# Patient Record
Sex: Male | Born: 1985 | Race: White | Marital: Married | Smoking: Never smoker
Health system: Southern US, Community
[De-identification: ages and names within clinical notes are randomized; demographics above are authoritative.]

## PROBLEM LIST (undated history)

## (undated) DIAGNOSIS — N200 Calculus of kidney: Secondary | ICD-10-CM

## (undated) DIAGNOSIS — E669 Obesity, unspecified: Secondary | ICD-10-CM

## (undated) DIAGNOSIS — J45909 Unspecified asthma, uncomplicated: Secondary | ICD-10-CM

## (undated) HISTORY — PX: SHOULDER SURGERY: SHX246

## (undated) HISTORY — DX: Unspecified asthma, uncomplicated: J45.909

## (undated) HISTORY — DX: Obesity, unspecified: E66.9

## (undated) HISTORY — DX: Calculus of kidney: N20.0

## (undated) HISTORY — PX: VASECTOMY: SHX75

---

## 2004-06-20 ENCOUNTER — Emergency Department: Payer: Self-pay | Admitting: General Practice

## 2004-06-25 ENCOUNTER — Ambulatory Visit: Payer: Self-pay | Admitting: Urology

## 2006-08-08 ENCOUNTER — Emergency Department: Payer: Self-pay | Admitting: Unknown Physician Specialty

## 2008-05-23 ENCOUNTER — Emergency Department: Payer: Self-pay | Admitting: Emergency Medicine

## 2009-02-18 ENCOUNTER — Ambulatory Visit: Payer: Self-pay | Admitting: Sports Medicine

## 2013-07-07 LAB — COMPREHENSIVE METABOLIC PANEL
Alkaline Phosphatase: 52 U/L
BUN: 15 mg/dL (ref 7–18)
Calcium, Total: 9.2 mg/dL (ref 8.5–10.1)
Chloride: 106 mmol/L (ref 98–107)
Co2: 26 mmol/L (ref 21–32)
Creatinine: 1.1 mg/dL (ref 0.60–1.30)
Glucose: 122 mg/dL — ABNORMAL HIGH (ref 65–99)
Potassium: 3.3 mmol/L — ABNORMAL LOW (ref 3.5–5.1)
SGOT(AST): 32 U/L (ref 15–37)

## 2013-07-07 LAB — CBC
HCT: 41.5 % (ref 40.0–52.0)
HGB: 14 g/dL (ref 13.0–18.0)
MCH: 31.8 pg (ref 26.0–34.0)
Platelet: 235 10*3/uL (ref 150–440)
RBC: 4.41 10*6/uL (ref 4.40–5.90)

## 2013-07-08 ENCOUNTER — Observation Stay: Payer: Self-pay

## 2013-07-08 ENCOUNTER — Ambulatory Visit: Payer: Self-pay

## 2013-07-08 LAB — URINALYSIS, COMPLETE
Bacteria: NONE SEEN
Bilirubin,UR: NEGATIVE
Glucose,UR: NEGATIVE mg/dL (ref 0–75)
Ketone: NEGATIVE
Leukocyte Esterase: NEGATIVE
Nitrite: NEGATIVE
Nitrite: NEGATIVE
Ph: 5 (ref 4.5–8.0)
Protein: NEGATIVE
RBC,UR: 122 /HPF (ref 0–5)
RBC,UR: 19 /HPF (ref 0–5)
Specific Gravity: 1.02 (ref 1.003–1.030)
Specific Gravity: 1.014 (ref 1.003–1.030)
Squamous Epithelial: NONE SEEN
Squamous Epithelial: <1
WBC UR: 5 /HPF (ref 0–5)

## 2013-07-09 LAB — URINE CULTURE

## 2013-07-11 DIAGNOSIS — Z87442 Personal history of urinary calculi: Secondary | ICD-10-CM | POA: Insufficient documentation

## 2013-07-12 ENCOUNTER — Ambulatory Visit: Payer: Self-pay | Admitting: Urology

## 2013-07-16 ENCOUNTER — Ambulatory Visit: Payer: Self-pay | Admitting: Urology

## 2013-07-23 ENCOUNTER — Ambulatory Visit: Payer: Self-pay | Admitting: Urology

## 2013-08-16 ENCOUNTER — Ambulatory Visit: Payer: Self-pay | Admitting: Urology

## 2014-08-20 DIAGNOSIS — M7521 Bicipital tendinitis, right shoulder: Secondary | ICD-10-CM | POA: Insufficient documentation

## 2014-08-20 DIAGNOSIS — S46911A Strain of unspecified muscle, fascia and tendon at shoulder and upper arm level, right arm, initial encounter: Secondary | ICD-10-CM | POA: Insufficient documentation

## 2014-11-09 ENCOUNTER — Emergency Department
Admit: 2014-11-09 | Disposition: A | Discharge status: Home / Self Care | Payer: Self-pay | Admitting: Emergency Medicine

## 2014-11-09 LAB — COMPREHENSIVE METABOLIC PANEL
ALBUMIN: 4 g/dL
ALK PHOS: 55 U/L
ALT: 36 U/L
AST: 24 U/L
Anion Gap: 5 — ABNORMAL LOW (ref 7–16)
BILIRUBIN TOTAL: 0.6 mg/dL
BUN: 11 mg/dL
Calcium, Total: 8.7 mg/dL — ABNORMAL LOW
Chloride: 109 mmol/L
Co2: 25 mmol/L
Creatinine: 1.04 mg/dL
EGFR (African American): >60
EGFR (Non-African Amer.): >60
GLUCOSE: 115 mg/dL — AB
POTASSIUM: 3.5 mmol/L
Sodium: 139 mmol/L
Total Protein: 7 g/dL

## 2014-11-09 LAB — URINALYSIS, COMPLETE
Bacteria: NONE SEEN
Bilirubin,UR: NEGATIVE
Blood: NEGATIVE
GLUCOSE, UR: NEGATIVE mg/dL (ref 0–75)
Ketone: NEGATIVE
Leukocyte Esterase: NEGATIVE
Nitrite: NEGATIVE
PROTEIN: NEGATIVE
Ph: 6 (ref 4.5–8.0)
Specific Gravity: 1.019 (ref 1.003–1.030)
Squamous Epithelial: NONE SEEN

## 2014-11-09 LAB — LIPASE, BLOOD: LIPASE: 33 U/L

## 2014-11-09 LAB — CBC
HCT: 43.8 % (ref 40.0–52.0)
HGB: 15 g/dL (ref 13.0–18.0)
MCH: 32.9 pg (ref 26.0–34.0)
MCHC: 34.4 g/dL (ref 32.0–36.0)
MCV: 96 fL (ref 80–100)
PLATELETS: 244 10*3/uL (ref 150–440)
RBC: 4.57 10*6/uL (ref 4.40–5.90)
RDW: 13.2 % (ref 11.5–14.5)
WBC: 6.5 10*3/uL (ref 3.8–10.6)

## 2014-11-15 NOTE — H&P (Signed)
Subjective/Chief Complaint Right flank pain   History of Present Illness 29 yo male in normal state of health until this evening when he had acute right back pain.  The patient was doubled over in pain and presented to the ED.  CT diagnosed right ureteral stone measuring 1cm with hydronephrosis.  After several doses of pain meds (morphine) urology was consulted for intractable pain.  Patient rates pain as 7/10.  He has a history of nephrolithiasis 7 years ago treated with ESWL.  He is unable to recall his urologist   Past History Shoulder surgery x 2 ESWL   Past Med/Surgical Hx:  Kidney Stones:   Denies medical history:   ALLERGIES:  No Known Allergies:   Family and Social History:  Family History Non-Contributory  no history of nephrolithiasis   Social History chewing tobacco   Place of Living Home   Review of Systems:  Subjective/Chief Complaint right back pain/abdominal pain   Fever/Chills No   Cough No   Sputum No   Abdominal Pain Yes   Diarrhea No   Constipation No   Nausea/Vomiting No   SOB/DOE No   Chest Pain No   Dysuria No   Physical Exam:  GEN well developed, well nourished, uncomfortable   HEENT PERRL   NECK supple   RESP normal resp effort   CARD regular rate   ABD positive Flank Tenderness   EXTR negative edema   SKIN normal to palpation   NEURO cranial nerves intact   PSYCH A+O to time, place, person   Lab Results: Hepatic:  13-Dec-14 21:55   Bilirubin, Total 0.4  Alkaline Phosphatase 52 (45-117 NOTE: New Reference Range 06/15/13)  SGPT (ALT) 38  SGOT (AST) 32  Total Protein, Serum 7.3  Albumin, Serum 3.9  Routine Chem:  13-Dec-14 21:55   Glucose, Serum  122  BUN 15  Creatinine (comp) 1.10  Sodium, Serum 137  Potassium, Serum  3.3  Chloride, Serum 106  CO2, Serum 26  Calcium (Total), Serum 9.2  Osmolality (calc) 276  eGFR (African American) >60  eGFR (Non-African American) >60 (eGFR values <70mL/min/1.73 m2  may be an indication of chronic kidney disease (CKD). Calculated eGFR is useful in patients with stable renal function. The eGFR calculation will not be reliable in acutely ill patients when serum creatinine is changing rapidly. It is not useful in  patients on dialysis. The eGFR calculation may not be applicable to patients at the low and high extremes of body sizes, pregnant women, and vegetarians.)  Anion Gap  5  Routine Hem:  13-Dec-14 21:55   WBC (CBC) 8.0  RBC (CBC) 4.41  Hemoglobin (CBC) 14.0  Hematocrit (CBC) 41.5  Platelet Count (CBC) 235 (Result(s) reported on 07 Jul 2013 at 10:14PM.)  MCV 94  MCH 31.8  MCHC 33.7  RDW 12.6   Radiology Results: LabUnknown:    14-Dec-14 00:55, CT Abdomen Pelvis WO for Stone  PACS Image  CT:  CT Abdomen Pelvis WO for Stone  REASON FOR EXAM:    R flank pain, R abd pain, h/o stones >7 years ago  COMMENTS:       PROCEDURE: CT  - CT ABDOMEN /PELVIS WO (STONE)  - Jul 08 2013 12:55AM     CLINICAL DATA:  Sudden onset of right flank pain.    EXAM:  CT ABDOMEN AND PELVIS WITHOUT    TECHNIQUE:  Multidetector CT imaging of the abdomen and pelvis was performed  following the standard protocol without IV contrast.  COMPARISON:  Report from prior CT 06/20/2004.  FINDINGS:  Bilateral intrarenal calculi, the majority of which measure 2-3 mm  in size. Marked right hydronephrosis and proximal hydroureter  secondary to a 5 x 10 mm calculus which has lodged opposite the  L2-L3 disc space. No left renal obstruction.    Within limits of unenhanced technique, normal appearing kidneys.  Again, within limits of unenhanced technique, remaining visualized  upper abdomen unremarkable. Visualized extreme lung bases clear. No  distal ureteral calculi on either side. Appendix identified and  normal.    Renal calculi   noted on the previous study from 2005.     IMPRESSION:  Bilateral nephrolithiasis.    Right hydronephrosis and proximal  hydroureter secondary to a 5 x 10  mm proximal ureteral calculus.      Electronically Signed    By: Rolla Flatten M.D.    On: 07/08/2013 01:29         Verified By: Staci Righter, M.D.,    Assessment/Admission Diagnosis 29 yo male with pain not resolved with pain meds 2/2 right ureteral nephrolithiasis Patient unlikely to pass 1cm stone  -To OR for right ureteral stent placement -Observation and possible d/c home -Plan for patient to f/u Dr. Bernardo Heater for ESWL vs. laser lithotripsy   Electronic Signatures: Margy Clarks (MD)  (Signed 14-Dec-14 04:16)  Authored: CHIEF COMPLAINT and HISTORY, PAST MEDICAL/SURGIAL HISTORY, ALLERGIES, FAMILY AND SOCIAL HISTORY, REVIEW OF SYSTEMS, PHYSICAL EXAM, LABS, Radiology, ASSESSMENT AND PLAN   Last Updated: 14-Dec-14 04:16 by Margy Clarks (MD)

## 2014-11-15 NOTE — Op Note (Signed)
PATIENT NAME:  Phillip Lucas, Phillip Lucas MR#:  [id number] DATE OF BIRTH:  1985/12/25  DATE OF PROCEDURE:  07/08/2013  PREOPERATIVE  DIAGNOSIS:  Right proximal nephrolithiasis.  POSTOPERATIVE DIAGNOSES:   1.  Right proximal nephrolithiasis. 2.  Right Hydronephrosis.  ANESTHESIA:  LMA  SPECIMENS:  Urinalysis and urine culture.  DRAINS: 6 x 26 double J ureteral stent.    PROCEDURES: 1.  Cystoscopy. 2.  Right retrograde pyelogram. 3.  Right ureteral stent placement.  INDICATION FOR PROCEDURE:  [name] is a 29 year old male who presented to the Emergency Department with pain that started in the evening of 12/13.  His pain was intractable and had not resolved with multiple doses of pain medication.  The patient had a CT scan which showed a right proximal 1 cm ureteral stone.  After discussing the risks, benefits, complications and all possible outcomes of the procedure, the patient agreed to proceed to the operating room for placement of a right ureteral stent for pain.   DESCRIPTION OF PROCEDURE:  After signing informed consent, the patient was brought back to the operating room and placed in the supine position.  Anesthesia was induced and the patient was placed in a lithotomy position.  He was prepped and draped in sterile fashion.  A dose of antibiotics was given prior to procedure.  A timeout was then performed.    The cystoscope was advanced into the urethra.  The bladder was visualized.  There were no abnormalities within the bladder.  The right ureteral orifice was identified and cannulated with a Sensor guide wire. An opened-ended ureteral stent was placed over the Sensor guide wire.  This was placed passed the proximal stone.  The Sensor guide wire was then removed and a urine sample was taken from the renal pelvis.  At this time, using Conray diluted with saline, we did a right retrograde pyelogram which showed significant hydronephrosis on the right side.  The sensor guide wire was then replaced  into the renal pelvis and a coil was noted.  The open-ended ureteral stent was removed.  We then placed a 6 x 26 double J ureteral stent over the Sensor guide wire into the renal pelvis.  This was done using fluoroscopic guidance.  The Sensor guide wire was removed and a coil was noted both in the renal pelvis as well as visualized within the bladder.  The bladder was emptied.  The patient was awoken from anesthesia without any difficulty.  The patient tolerated the procedure well.  I was present and scrubbed for the entire case.     ____________________________ Margy Clarks, MD erm:dp D: 07/11/2013 09:34:55 ET T: 07/11/2013 10:55:01 ET JOB#: [id number]  cc: Margy Clarks, MD, <Dictator> Lodema [facility] MD ELECTRONICALLY SIGNED 07/11/2013 13:47

## 2016-01-20 ENCOUNTER — Other Ambulatory Visit
Admission: RE | Admit: 2016-01-20 | Discharge: 2016-01-20 | Disposition: A | Discharge status: Home / Self Care | Payer: Self-pay | Source: Ambulatory Visit | Attending: Family Medicine | Admitting: Family Medicine

## 2016-01-20 DIAGNOSIS — R0609 Other forms of dyspnea: Secondary | ICD-10-CM | POA: Insufficient documentation

## 2016-01-20 DIAGNOSIS — R635 Abnormal weight gain: Secondary | ICD-10-CM | POA: Insufficient documentation

## 2016-01-20 DIAGNOSIS — R600 Localized edema: Secondary | ICD-10-CM | POA: Insufficient documentation

## 2016-01-20 LAB — BRAIN NATRIURETIC PEPTIDE: B Natriuretic Peptide: 13 pg/mL (ref 0.0–100.0)

## 2016-02-09 DIAGNOSIS — R600 Localized edema: Secondary | ICD-10-CM | POA: Insufficient documentation

## 2016-02-09 DIAGNOSIS — I10 Essential (primary) hypertension: Secondary | ICD-10-CM | POA: Insufficient documentation

## 2017-03-06 IMAGING — CT CT ABD-PELV W/ CM
1 series · 15 of 32 positions shown, 19 images · IV contrast (omnipaque)
Comparison: Prior CT abdomen/ pelvis 07/08/2013

CLINICAL DATA: 28-year-old male with 2 day history of sharp right
lower quadrant pain

EXAM:
CT ABDOMEN AND PELVIS WITH CONTRAST
TECHNIQUE: Multidetector CT imaging of the abdomen and pelvis was performed
using the standard protocol following bolus administration of
intravenous contrast.
CONTRAST:  100 mL Omnipaque 300

[Series 2: routine abd pel with · axial · 0.79mm/px · z∈[-528,-102]mm · 15 of 96 slices shown, 19 images]
[im 7/96  soft-tissue]
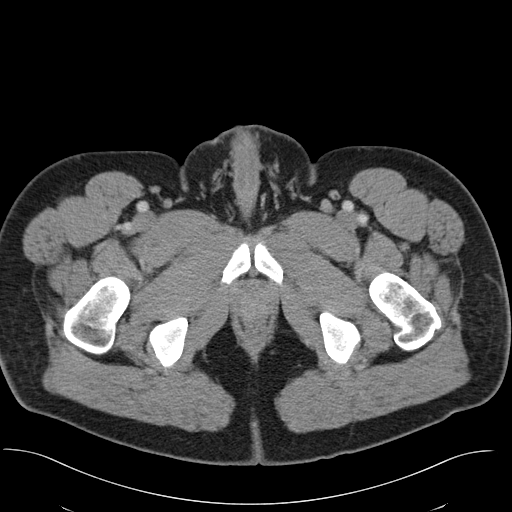
[im 7/96  bone]
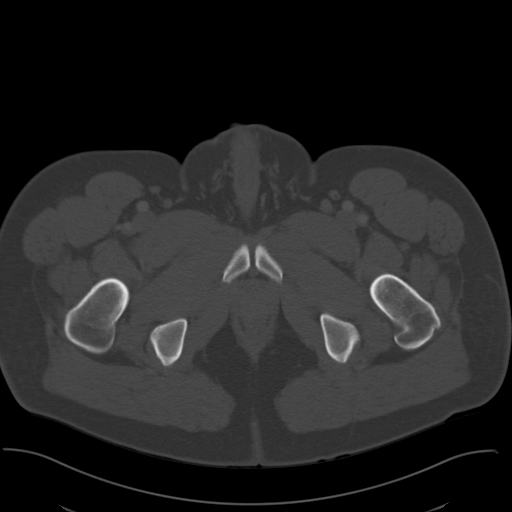
[im 13/96  soft-tissue]
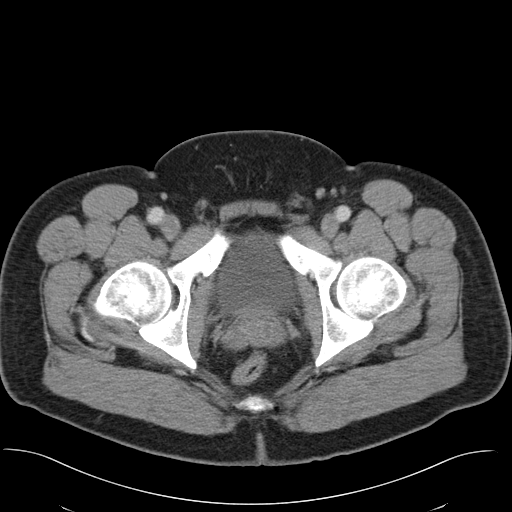
[im 19/96  soft-tissue]
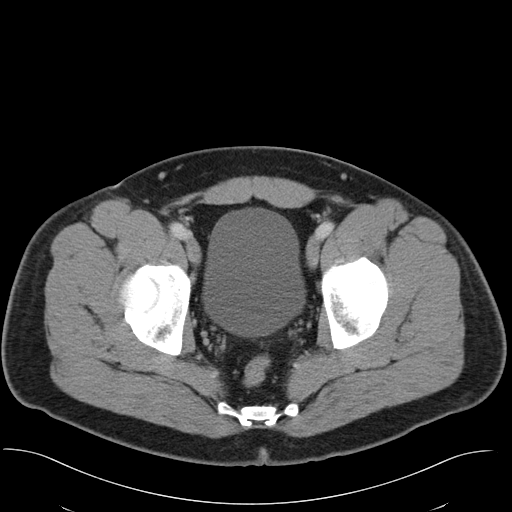
[im 28/96  soft-tissue]
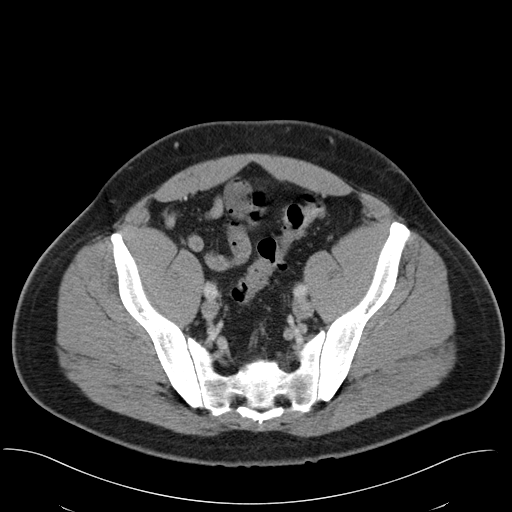
[im 34/96  soft-tissue]
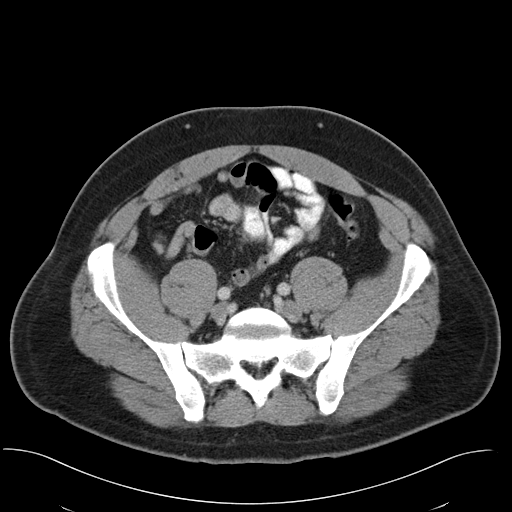
[im 40/96  soft-tissue]
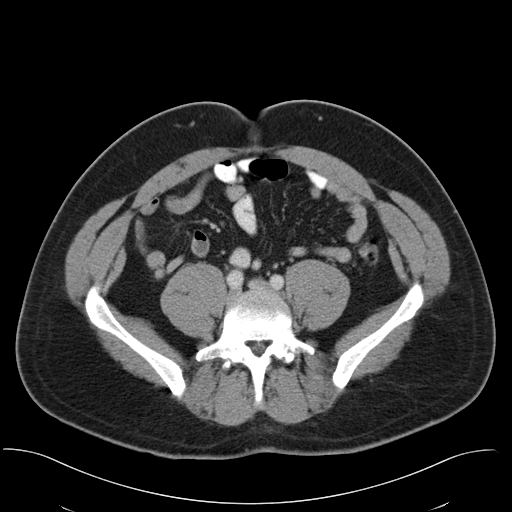
[im 50/96  soft-tissue]
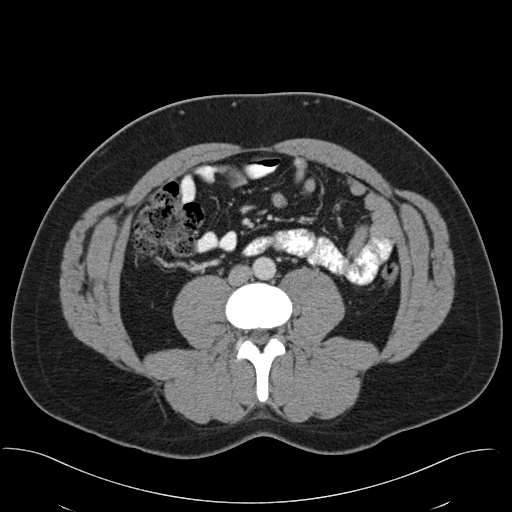
[im 56/96  soft-tissue]
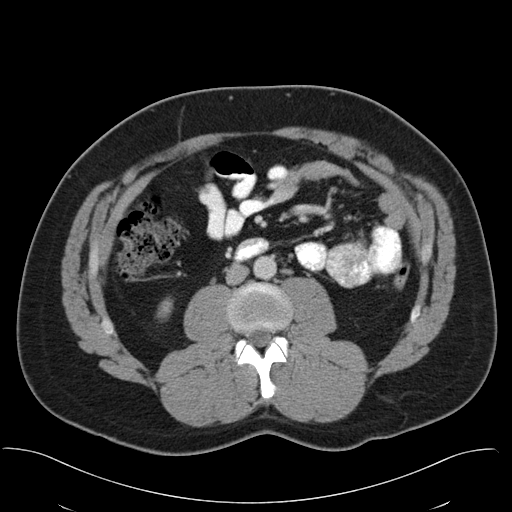
[im 62/96  soft-tissue]
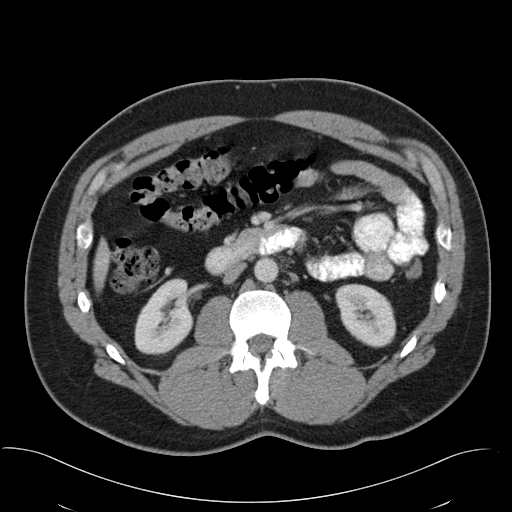
[im 62/96  bone]
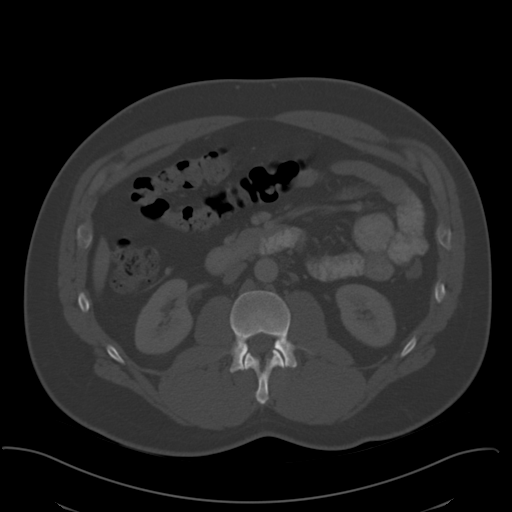
[im 68/96  soft-tissue]
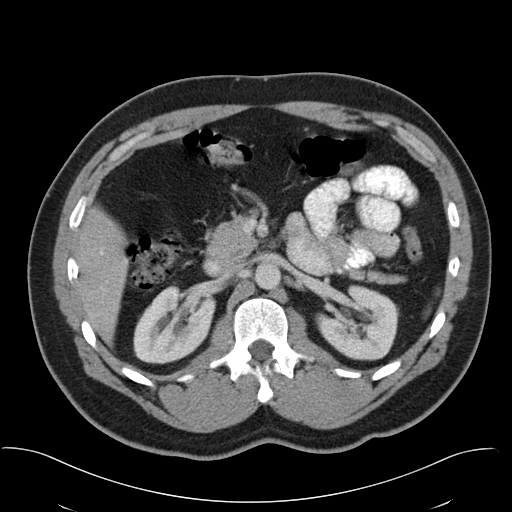
[im 77/96  soft-tissue]
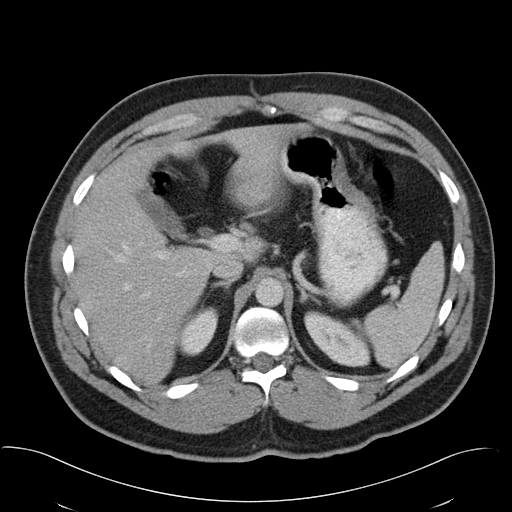
[im 83/96  soft-tissue]
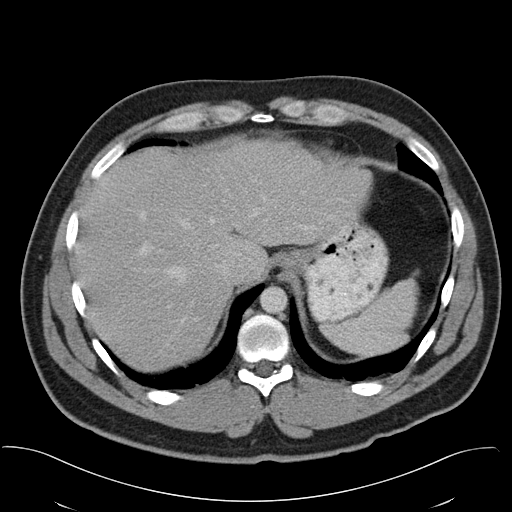
[im 83/96  lung]
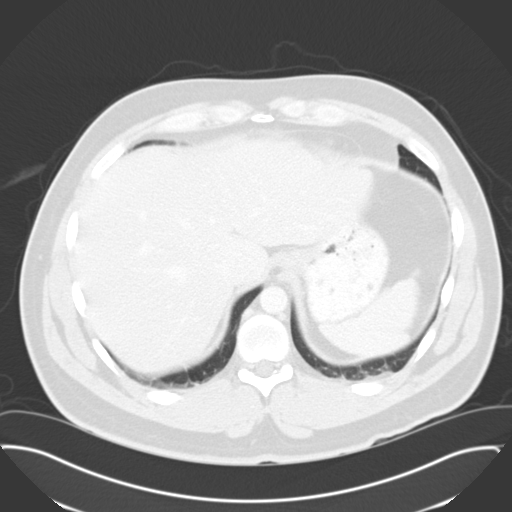
[im 86/96  lung]
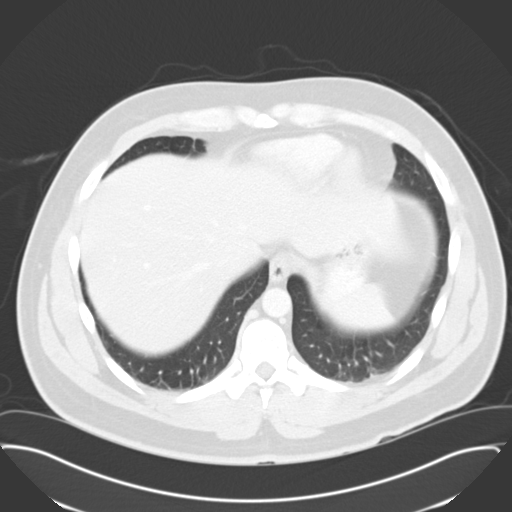
[im 89/96  soft-tissue]
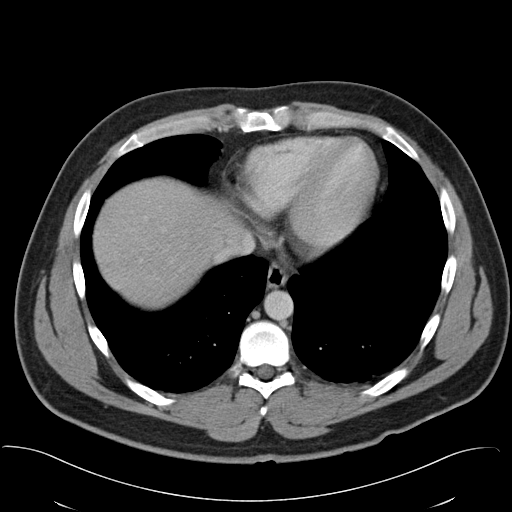
[im 89/96  lung]
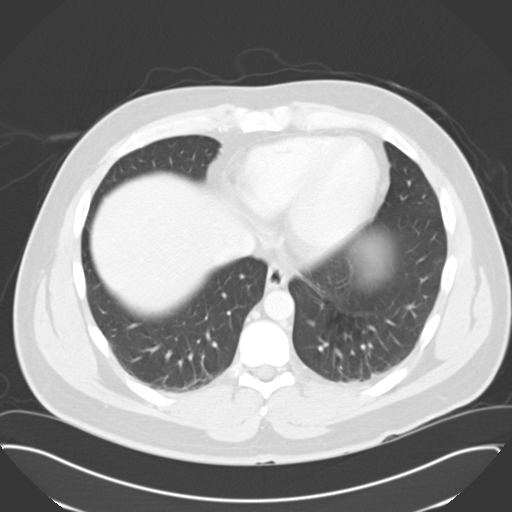
[im 92/96  lung]
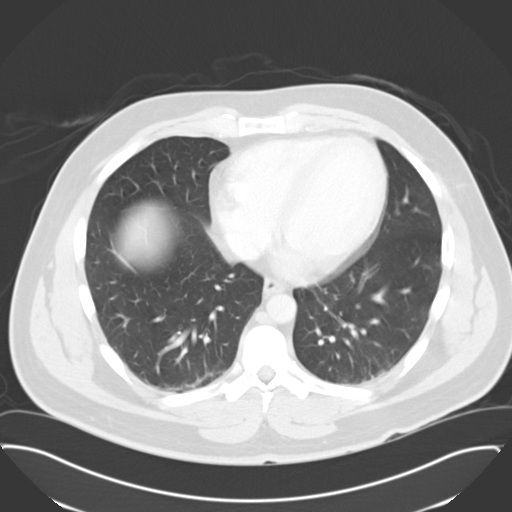

[15 of 32 positions shown; findings below may reference images not displayed]

FINDINGS: Lower Chest: The lung bases are clear. Visualized cardiac structures
are within normal limits for size. No pericardial effusion.
Unremarkable visualized distal thoracic esophagus.

Abdomen: Unremarkable CT appearance of the stomach, duodenum,
spleen, adrenal glands and pancreas. Normal hepatic contour and
morphology. No discrete lesion. Gallbladder is unremarkable. No
intra or extrahepatic biliary ductal dilatation.

Unremarkable appearance of the bilateral kidneys. No focal solid
lesion, hydronephrosis or nephrolithiasis.

No evidence of obstruction or focal bowel wall thickening. Normal
appendix in the right lower quadrant. The terminal ileum is
unremarkable. No free fluid or suspicious adenopathy.

Pelvis: Unremarkable bladder, prostate gland and seminal vesicles.
No free fluid or suspicious adenopathy.

Bones/Soft Tissues: No acute fracture or aggressive appearing lytic
or blastic osseous lesion. Small fat containing periumbilical
hernia.

Vascular: No significant atherosclerotic vascular disease,
aneurysmal dilatation or acute abnormality.
IMPRESSION: Negative CT scan of the abdomen and pelvis.

## 2017-03-06 IMAGING — US US RENAL KIDNEY
1 series · 14 of 25 positions shown · non-contrast
Comparison: Prior CT scan of the abdomen 07/08/2013

CLINICAL DATA: 28-year-old male with right side pain, history of
renal stones

EXAM:
RENAL/URINARY TRACT ULTRASOUND COMPLETE

[Series 1: us renal kidney · 0.25mm/px · 14 of 54 slices shown]
[im 1/54]
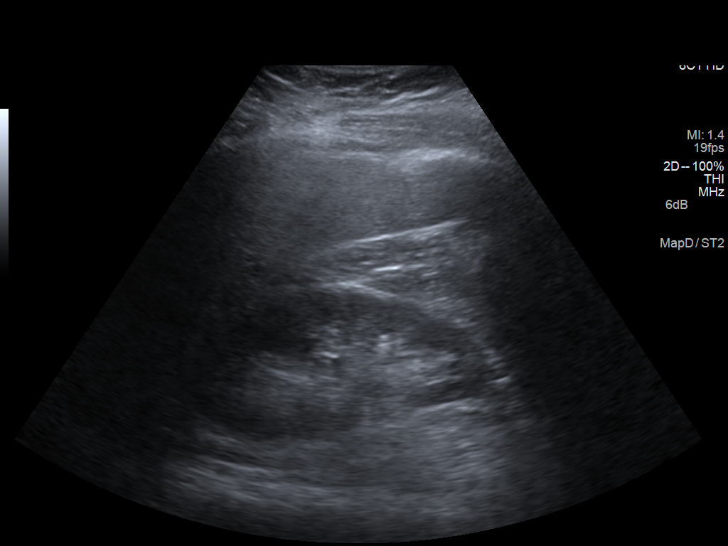
[im 5/54]
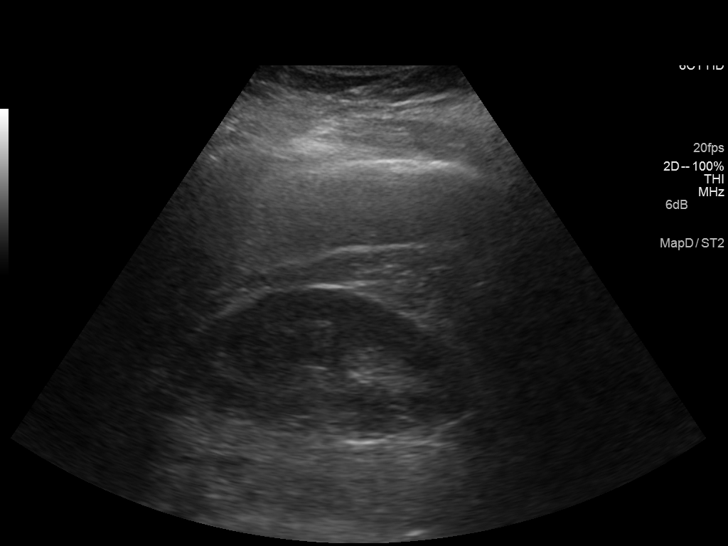
[im 9/54]
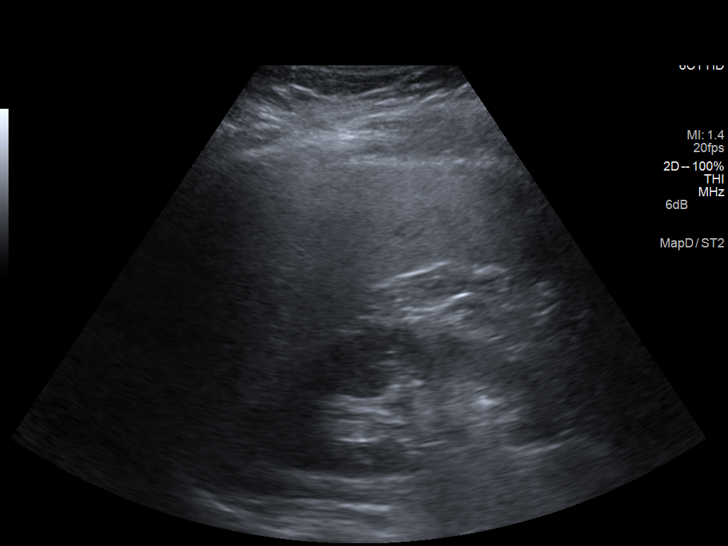
[im 14/54]
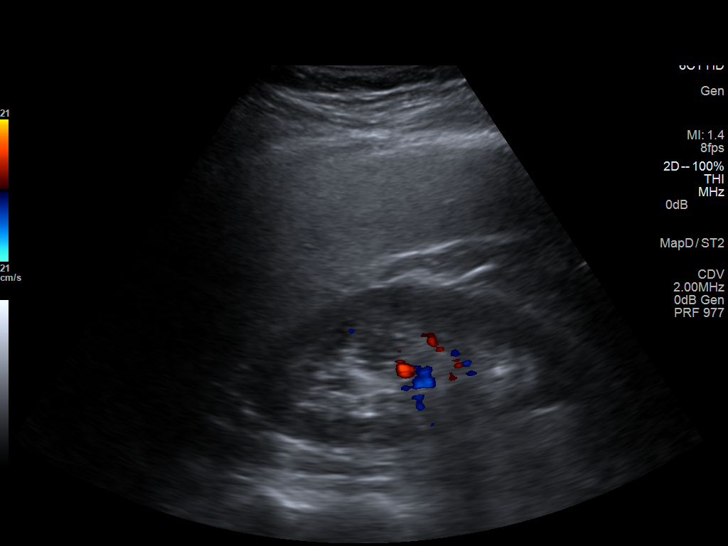
[im 18/54]
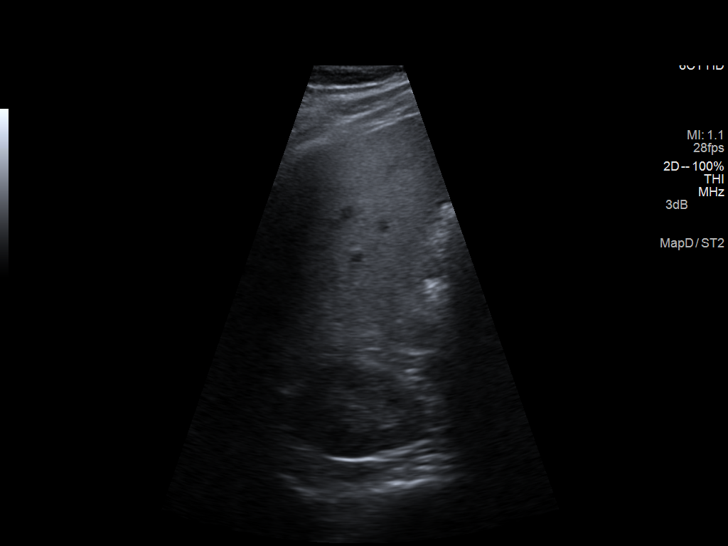
[im 20/54]
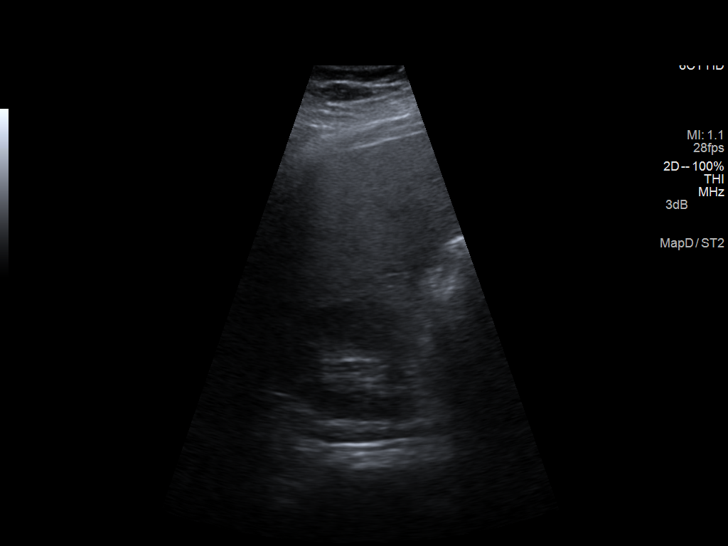
[im 25/54]
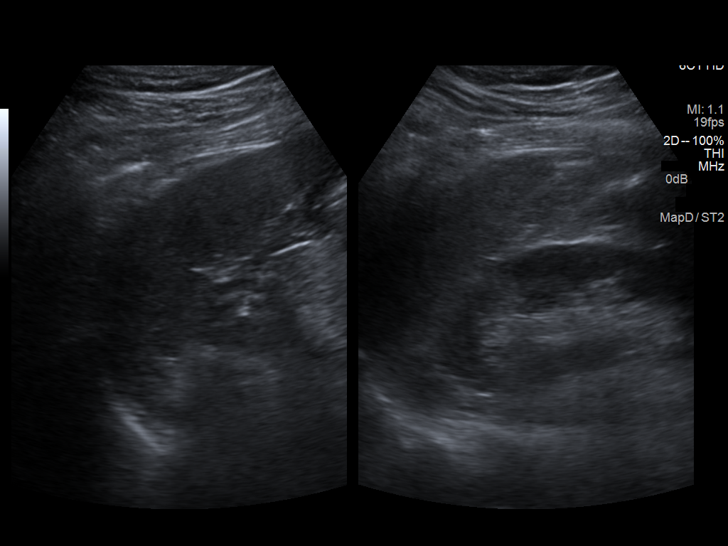
[im 29/54]
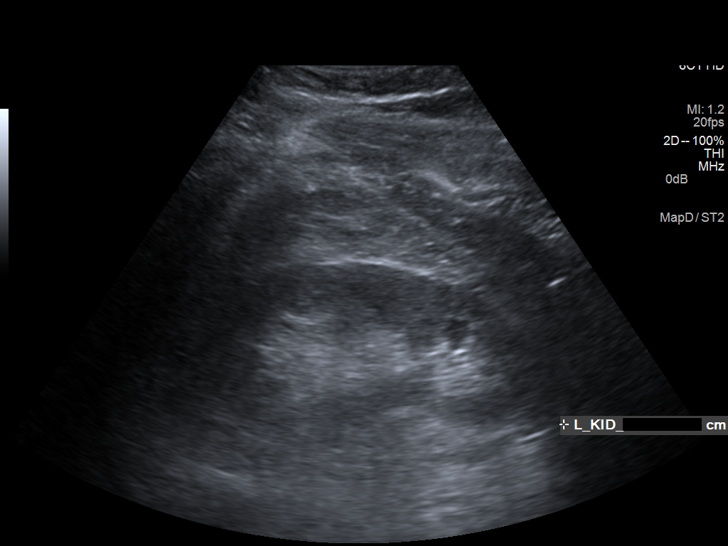
[im 34/54]
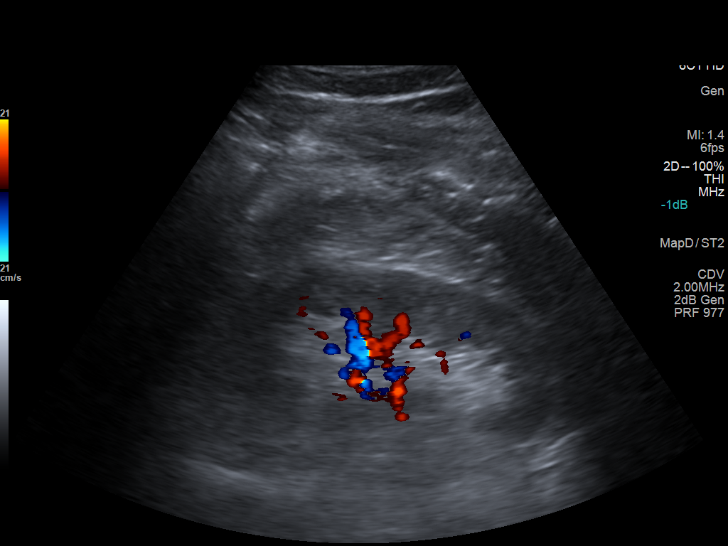
[im 36/54]
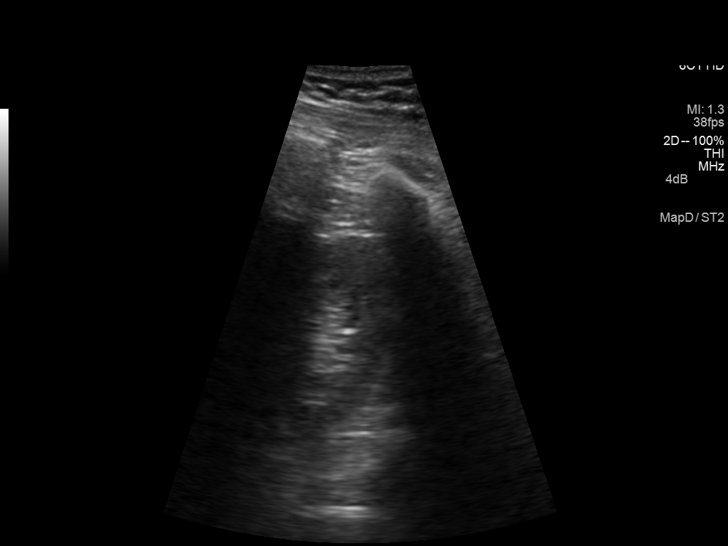
[im 40/54]
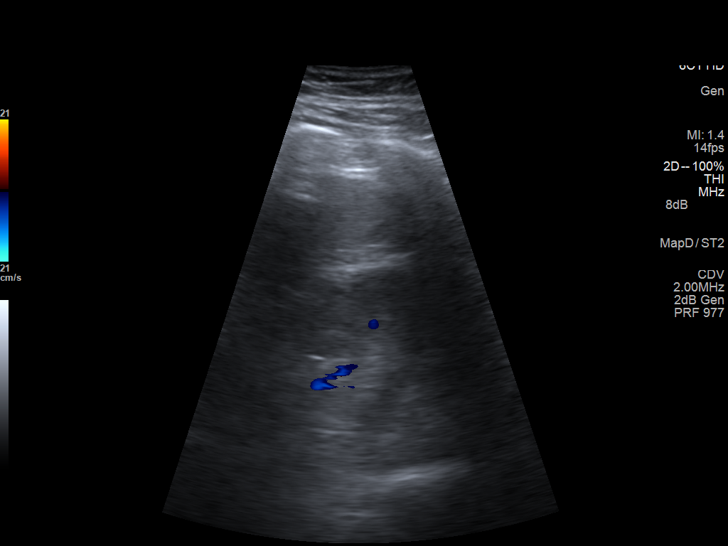
[im 45/54]
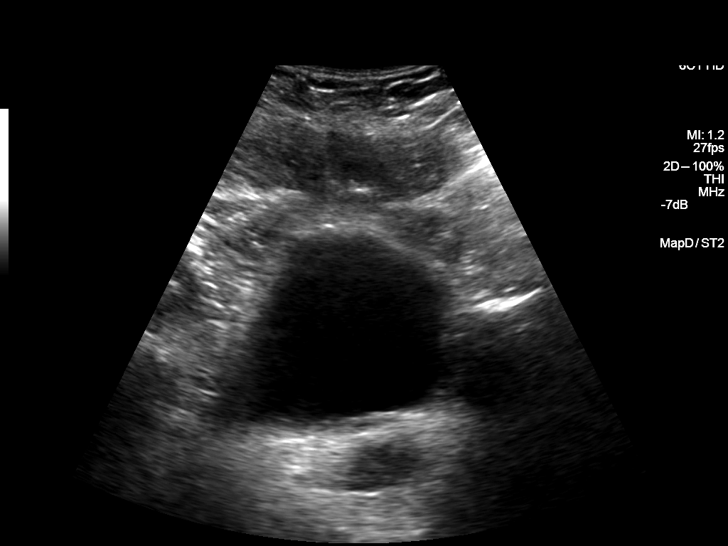
[im 49/54]
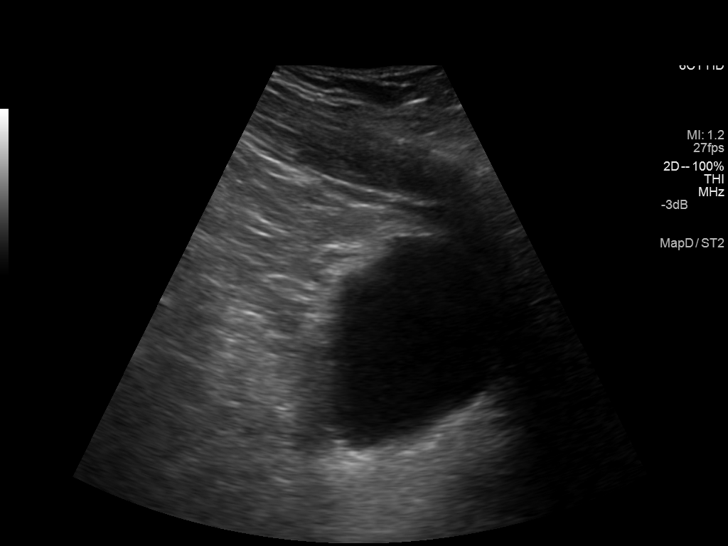
[im 54/54]
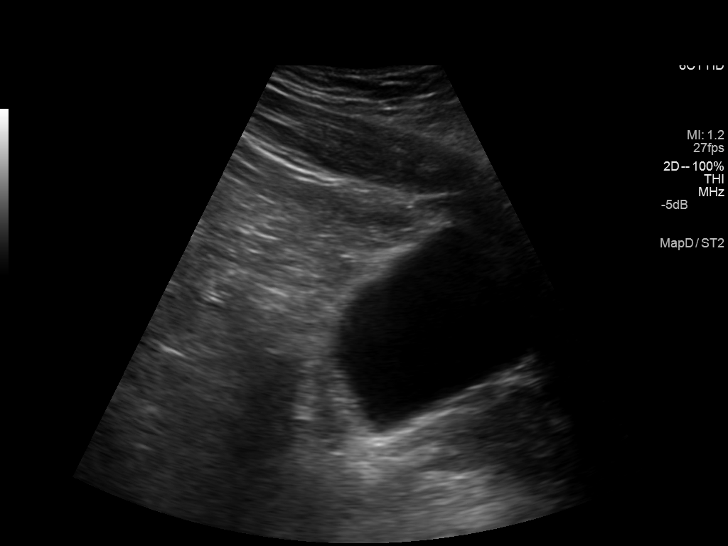

[14 of 25 positions shown; findings below may reference images not displayed]

FINDINGS: Right Kidney:

Length: 11 cm. Echogenicity within normal limits. No mass or
hydronephrosis visualized.

Left Kidney:

Length: 11.2 cm. Echogenicity within normal limits. No mass or
hydronephrosis visualized.

Bladder:

Appears normal for degree of bladder distention.
IMPRESSION: Normal renal ultrasound.  No evidence of hydronephrosis.

## 2018-12-25 ENCOUNTER — Encounter: Payer: Self-pay | Admitting: Family Medicine

## 2018-12-25 ENCOUNTER — Ambulatory Visit (INDEPENDENT_AMBULATORY_CARE_PROVIDER_SITE_OTHER): Payer: BC Managed Care – PPO | Admitting: Family Medicine

## 2018-12-25 VITALS — Ht 71.5 in | Wt 211.0 lb

## 2018-12-25 DIAGNOSIS — Z8249 Family history of ischemic heart disease and other diseases of the circulatory system: Secondary | ICD-10-CM | POA: Diagnosis not present

## 2018-12-25 DIAGNOSIS — E663 Overweight: Secondary | ICD-10-CM | POA: Diagnosis not present

## 2018-12-25 DIAGNOSIS — R739 Hyperglycemia, unspecified: Secondary | ICD-10-CM | POA: Diagnosis not present

## 2018-12-25 DIAGNOSIS — E669 Obesity, unspecified: Secondary | ICD-10-CM | POA: Insufficient documentation

## 2018-12-25 DIAGNOSIS — Z87442 Personal history of urinary calculi: Secondary | ICD-10-CM | POA: Diagnosis not present

## 2018-12-25 DIAGNOSIS — Z720 Tobacco use: Secondary | ICD-10-CM

## 2018-12-25 NOTE — Assessment & Plan Note (Signed)
Encourage cessation Patient is pre-contemplative at this time as he feels like his stress level is too high to consider quitting We will continue to reassess

## 2018-12-25 NOTE — Assessment & Plan Note (Signed)
Discussed with patient the importance of monitoring for any risk factors for heart disease in him Screening labs today Check blood pressure at next visit

## 2018-12-25 NOTE — Progress Notes (Signed)
Patient: Phillip Lucas, Male    DOB: 1986-04-20, 33 y.o.   MRN: [id number] Visit Date: 12/25/2018  Today's Provider: Lavon Paganini, MD   Chief Complaint  Patient presents with  . Establish Care   Subjective:    Virtual Visit via Video Note  I connected with Phillip Lucas on 12/25/18 at 10:00 AM EDT by a video enabled telemedicine application and verified that I am speaking with the correct person using two identifiers.  Patient location: home Provider location: [place] involved in the visit: patient, provider    I discussed the limitations of evaluation and management by telemedicine and the availability of in person appointments. The patient expressed understanding and agreed to proceed.    New Patient:  Phillip Lucas is a 33 y.o. male who presents today to Establish Care. He feels well. He reports exercising 4 days per week. He reports he is sleeping well.  States that has not had PCP in nearly 10 years.  His wife is CRNA, so he will field questions by her sometimes.  Has a h/o kidney stones.  Had lithotripsy for 8-35mm stones twice.  Diet has changed and lost 65lbs and hasn't had any further problems since then.  Cut back on mountain dew and drinking more water. Used Keto Diet  States that he gets sick twice per year with bronchitis.  Was told that he had asthma and this contributed  L shoulder surgery x2 (2002 and 2010) Trauma due to football/wrestling injury  Chewing tobacco use Tried to quit ~9 months ago when newborn was born Being out of work and having newborn makes it difficult to quit at this time due to stress -----------------------------------------------------------------   Review of Systems  Constitutional: Negative.   HENT: Negative.   Eyes: Negative.   Respiratory: Negative.   Cardiovascular: Negative.   Gastrointestinal: Negative.   Endocrine: Negative.   Genitourinary: Negative.   Musculoskeletal:  Negative.   Skin: Negative.   Allergic/Immunologic: Negative.   Neurological: Negative.   Hematological: Negative.   Psychiatric/Behavioral: Negative.     Social History      He  reports that he has never smoked. His smokeless tobacco use includes snuff. He reports current alcohol use. He reports that he does not use drugs.       Social History   Socioeconomic History  . Marital status: Married    Spouse name: Not on file  . Number of children: 1  . Years of education: Not on file  . Highest education level: Not on file  Occupational History  . Occupation: Arboriculturist  . Financial resource strain: Not on file  . Food insecurity:    Worry: Not on file    Inability: Not on file  . Transportation needs:    Medical: Not on file    Non-medical: Not on file  Tobacco Use  . Smoking status: Never Smoker  . Smokeless tobacco: Current User    Types: Snuff  Substance and Sexual Activity  . Alcohol use: Yes    Comment: socially  . Drug use: Never  . Sexual activity: Yes    Partners: Female    Birth control/protection: None  Lifestyle  . Physical activity:    Days per week: Not on file    Minutes per session: Not on file  . Stress: Not on file  Relationships  . Social connections:    Talks on phone: Not on file  Gets together: Not on file    Attends religious service: Not on file    Active member of club or organization: Not on file    Attends meetings of clubs or organizations: Not on file    Relationship status: Not on file  Other Topics Concern  . Not on file  Social History Narrative  . Not on file    Past Medical History:  Diagnosis Date  . Asthma   . Calculus of kidney   . Obesity      Patient Active Problem List   Diagnosis Date Noted  . Overweight 12/25/2018  . Family history of early CAD 12/25/2018  . Chewing tobacco use 12/25/2018  . Hyperglycemia 12/25/2018  . History of kidney stones 07/11/2013    Past Surgical History:   Procedure Laterality Date  . LITHOTRIPSY  2008  . SHOULDER SURGERY Left     Family History        Family Status  Relation Name Status  . Mother  Deceased  . Father  Deceased  . MGM  Deceased  . MGF  Deceased  . PGM  Deceased  . PGF  Deceased        His family history includes Aneurysm in his paternal grandmother; COPD in his maternal grandfather, paternal grandfather, and paternal grandmother; Coronary artery disease in his father; Diabetes in his father; Drug abuse in his father and mother; Heart attack (age of onset: 58) in his father.      No Known Allergies  No current outpatient medications on file.   Patient Care Team: Virginia Crews, MD as PCP - General (Family Medicine)    Objective:    Vitals: Ht 5' 11.5" (1.816 m)   Wt 211 lb (95.7 kg)   BMI 29.02 kg/m    Vitals:   12/25/18 1007  Weight: 211 lb (95.7 kg)  Height: 5' 11.5" (1.816 m)     Physical Exam Constitutional:      Appearance: Normal appearance.  Pulmonary:     Effort: Pulmonary effort is normal. No respiratory distress.  Neurological:     Mental Status: He is alert and oriented to person, place, and time.  Psychiatric:        Mood and Affect: Mood normal.        Behavior: Behavior normal.      Depression Screen PHQ 2/9 Scores 12/25/2018  PHQ - 2 Score 0       Assessment & Plan:   I discussed the assessment and treatment plan with the patient. The patient was provided an opportunity to ask questions and all were answered. The patient agreed with the plan and demonstrated an understanding of the instructions.   The patient was advised to call back or seek an in-person evaluation if the symptoms worsen or if the condition fails to improve as anticipated.   Establish care  Exercise Activities and Dietary recommendations Goals   None      There is no immunization history on file for this patient.  Health Maintenance  Topic Date Due  . HIV Screening  06/15/1999  .  TETANUS/TDAP  06/15/2003  . INFLUENZA VACCINE  02/24/2019     Discussed health benefits of physical activity, and encouraged him to engage in regular exercise appropriate for his age and condition.    --------------------------------------------------------------------  Problem List Items Addressed This Visit      Other   History of kidney stones    Possibly diet mediated Doing much better with no  recurrent stones since changing his diet and losing weight We will monitor for any recurrence      Overweight - Primary    Congratulated patient on recent weight loss He is on the keto diet and worried about cholesterol, which we will be checking today Discussed importance of eating healthy fats as well      Relevant Orders   Lipid panel   Comprehensive metabolic panel   Hemoglobin A1c   CBC w/Diff/Platelet   Family history of early CAD    Discussed with patient the importance of monitoring for any risk factors for heart disease in him Screening labs today Check blood pressure at next visit      Relevant Orders   Lipid panel   Comprehensive metabolic panel   Hemoglobin A1c   CBC w/Diff/Platelet   Chewing tobacco use    Encourage cessation Patient is pre-contemplative at this time as he feels like his stress level is too high to consider quitting We will continue to reassess      Hyperglycemia    Noted on last labs from 2016 Check A1c      Relevant Orders   Comprehensive metabolic panel   Hemoglobin A1c       Return in about 1 year (around 12/25/2019) for CPE.   The entirety of the information documented in the History of Present Illness, Review of Systems and Physical Exam were personally obtained by me. Portions of this information were initially documented by Tiburcio Pea, CMA and reviewed by me for thoroughness and accuracy.    Winfrey Chillemi, Dionne Bucy, MD MPH [facility]

## 2018-12-25 NOTE — Patient Instructions (Signed)
Preventive Care 18-39 Years, Male Preventive care refers to lifestyle choices and visits with your health care provider that can promote health and wellness. What does preventive care include?   A yearly physical exam. This is also called an annual well check.  Dental exams once or twice a year.  Routine eye exams. Ask your health care provider how often you should have your eyes checked.  Personal lifestyle choices, including: ? Daily care of your teeth and gums. ? Regular physical activity. ? Eating a healthy diet. ? Avoiding tobacco and drug use. ? Limiting alcohol use. ? Practicing safe sex. What happens during an annual well check? The services and screenings done by your health care provider during your annual well check will depend on your age, overall health, lifestyle risk factors, and family history of disease. Counseling Your health care provider may ask you questions about your:  Alcohol use.  Tobacco use.  Drug use.  Emotional well-being.  Home and relationship well-being.  Sexual activity.  Eating habits.  Work and work environment. Screening You may have the following tests or measurements:  Height, weight, and BMI.  Blood pressure.  Lipid and cholesterol levels. These may be checked every 5 years starting at age 20.  Diabetes screening. This is done by checking your blood sugar (glucose) after you have not eaten for a while (fasting).  Skin check.  Hepatitis C blood test.  Hepatitis B blood test.  Sexually transmitted disease (STD) testing. Discuss your test results, treatment options, and if necessary, the need for more tests with your health care provider. Vaccines Your health care provider may recommend certain vaccines, such as:  Influenza vaccine. This is recommended every year.  Tetanus, diphtheria, and acellular pertussis (Tdap, Td) vaccine. You may need a Td booster every 10 years.  Varicella vaccine. You may need this if you  have not been vaccinated.  HPV vaccine. If you are 26 or younger, you may need three doses over 6 months.  Measles, mumps, and rubella (MMR) vaccine. You may need at least one dose of MMR.You may also need a second dose.  Pneumococcal 13-valent conjugate (PCV13) vaccine. You may need this if you have certain conditions and have not been vaccinated.  Pneumococcal polysaccharide (PPSV23) vaccine. You may need one or two doses if you smoke cigarettes or if you have certain conditions.  Meningococcal vaccine. One dose is recommended if you are age 19-21 years and a first-year college student living in a residence hall, or if you have one of several medical conditions. You may also need additional booster doses.  Hepatitis A vaccine. You may need this if you have certain conditions or if you travel or work in places where you may be exposed to hepatitis A.  Hepatitis B vaccine. You may need this if you have certain conditions or if you travel or work in places where you may be exposed to hepatitis B.  Haemophilus influenzae type b (Hib) vaccine. You may need this if you have certain risk factors. Talk to your health care provider about which screenings and vaccines you need and how often you need them. This information is not intended to replace advice given to you by your health care provider. Make sure you discuss any questions you have with your health care provider. Document Released: 09/07/2001 Document Revised: 02/22/2017 Document Reviewed: 05/13/2015 Elsevier Interactive Patient Education  2019 [employer]vier Inc[employer]  

## 2018-12-25 NOTE — Assessment & Plan Note (Signed)
Noted on last labs from 2016 Check A1c

## 2018-12-25 NOTE — Assessment & Plan Note (Signed)
Possibly diet mediated Doing much better with no recurrent stones since changing his diet and losing weight We will monitor for any recurrence

## 2018-12-25 NOTE — Assessment & Plan Note (Signed)
Congratulated patient on recent weight loss He is on the keto diet and worried about cholesterol, which we will be checking today Discussed importance of eating healthy fats as well

## 2018-12-28 ENCOUNTER — Telehealth: Payer: Self-pay

## 2018-12-28 LAB — CBC WITH DIFFERENTIAL/PLATELET
Basophils Absolute: 0 10*3/uL (ref 0.0–0.2)
Basos: 0 %
EOS (ABSOLUTE): 0.1 10*3/uL (ref 0.0–0.4)
Eos: 2 %
Hematocrit: 43.5 % (ref 37.5–51.0)
Hemoglobin: 15 g/dL (ref 13.0–17.7)
Immature Grans (Abs): 0 10*3/uL (ref 0.0–0.1)
Immature Granulocytes: 0 %
Lymphocytes Absolute: 1.6 10*3/uL (ref 0.7–3.1)
Lymphs: 36 %
MCH: 33 pg (ref 26.6–33.0)
MCHC: 34.5 g/dL (ref 31.5–35.7)
MCV: 96 fL (ref 79–97)
Monocytes Absolute: 0.5 10*3/uL (ref 0.1–0.9)
Monocytes: 11 %
Neutrophils Absolute: 2.3 10*3/uL (ref 1.4–7.0)
Neutrophils: 51 %
Platelets: 202 10*3/uL (ref 150–450)
RBC: 4.55 x10E6/uL (ref 4.14–5.80)
RDW: 13.2 % (ref 11.6–15.4)
WBC: 4.5 10*3/uL (ref 3.4–10.8)

## 2018-12-28 LAB — COMPREHENSIVE METABOLIC PANEL
ALT: 29 IU/L (ref 0–44)
AST: 22 IU/L (ref 0–40)
Albumin/Globulin Ratio: 2.1 (ref 1.2–2.2)
Albumin: 4.7 g/dL (ref 4.0–5.0)
Alkaline Phosphatase: 50 IU/L (ref 39–117)
BUN/Creatinine Ratio: 18 (ref 9–20)
BUN: 18 mg/dL (ref 6–20)
Bilirubin Total: 0.6 mg/dL (ref 0.0–1.2)
CO2: 22 mmol/L (ref 20–29)
Calcium: 9.2 mg/dL (ref 8.7–10.2)
Chloride: 103 mmol/L (ref 96–106)
Creatinine, Ser: 0.98 mg/dL (ref 0.76–1.27)
GFR calc Af Amer: 116 mL/min/{1.73_m2} (ref >59)
GFR calc non Af Amer: 100 mL/min/{1.73_m2} (ref >59)
Globulin, Total: 2.2 g/dL (ref 1.5–4.5)
Glucose: 93 mg/dL (ref 65–99)
Potassium: 4.1 mmol/L (ref 3.5–5.2)
Sodium: 139 mmol/L (ref 134–144)
Total Protein: 6.9 g/dL (ref 6.0–8.5)

## 2018-12-28 LAB — HEMOGLOBIN A1C
Est. average glucose Bld gHb Est-mCnc: 105 mg/dL
Hgb A1c MFr Bld: 5.3 % (ref 4.8–5.6)

## 2018-12-28 LAB — LIPID PANEL
Chol/HDL Ratio: 4.5 ratio (ref 0.0–5.0)
Cholesterol, Total: 194 mg/dL (ref 100–199)
HDL: 43 mg/dL (ref >39)
LDL Calculated: 133 mg/dL — ABNORMAL HIGH (ref 0–99)
Triglycerides: 90 mg/dL (ref 0–149)
VLDL Cholesterol Cal: 18 mg/dL (ref 5–40)

## 2018-12-28 NOTE — Telephone Encounter (Signed)
-----   Message from Virginia Crews, MD sent at 12/28/2018 11:57 AM EDT ----- Normal labs, except slightly elevated cholesterol.  Recommend diet low in saturated fat and regular exercise - 30 min at least 5 times per week

## 2018-12-28 NOTE — Telephone Encounter (Signed)
Patient notified of lab results and instructions for diet and exercise.

## 2019-07-17 ENCOUNTER — Encounter: Payer: Self-pay | Admitting: Emergency Medicine

## 2019-07-17 ENCOUNTER — Ambulatory Visit
Admission: EM | Admit: 2019-07-17 | Discharge: 2019-07-17 | Disposition: A | Discharge status: Home / Self Care | Payer: BC Managed Care – PPO | Attending: Urgent Care | Admitting: Urgent Care

## 2019-07-17 ENCOUNTER — Other Ambulatory Visit: Payer: Self-pay

## 2019-07-17 DIAGNOSIS — J069 Acute upper respiratory infection, unspecified: Secondary | ICD-10-CM

## 2019-07-17 DIAGNOSIS — Z20828 Contact with and (suspected) exposure to other viral communicable diseases: Secondary | ICD-10-CM | POA: Diagnosis not present

## 2019-07-17 DIAGNOSIS — Z20822 Contact with and (suspected) exposure to covid-19: Secondary | ICD-10-CM

## 2019-07-17 NOTE — ED Triage Notes (Signed)
Patient in today after having a +covid exposure on Friday (07/13/19). Patient started with cough on Sunday (07/15/19) and hoarseness this morning. Patient denies any other symptoms.

## 2019-07-17 NOTE — Discharge Instructions (Addendum)
It was very nice seeing you today in clinic. Thank you for entrusting me with your care.   Rest and stay HYDRATED. Water and electrolyte containing beverages (Gatorade, Pedialyte) are best to prevent dehydration and electrolyte abnormalities.  May use Tylenol and/or Ibuprofen as needed for pain/fever.    You were tested for SARS-CoV-2 (novel coronavirus) today. Testing is performed by an outside lab ([facility]) and has variable turn around times ranging between 2-5 days. Current recommendations from the the CDC and [place] DHHS require that you remain out of work in order to quarantine at home until negative test results are have been received. In the event that your test results are positive, you will be contacted with further directives. These measures are being implemented out of an abundance of caution to prevent transmission and spread during the current SARS-CoV-2 pandemic.  Make arrangements to follow up with your regular doctor in 1 week for re-evaluation if not improving. If your symptoms/condition worsens, please seek follow up care either here or in the ER. Please remember, our [place] providers are "right here with you" when you need [place].   Again, it was my pleasure to take care of you today. Thank you for choosing our clinic. I hope that you start to feel better quickly.   Honor Loh, MSN, APRN, FNP-C, CEN Advanced Practice Provider [facility]

## 2019-07-18 LAB — NOVEL CORONAVIRUS, NAA (HOSP ORDER, SEND-OUT TO REF LAB; TAT 18-24 HRS): SARS-CoV-2, NAA: NOT DETECTED

## 2019-07-18 NOTE — ED Provider Notes (Signed)
[place]   Name: Phillip Lucas DOB: 09/23/1985 MRN: [id number] CSN: [id number] PCP: Virginia Crews, MD  Arrival date and time:  07/17/19 1411  Chief Complaint:  Cough and COVID exposure   NOTE: Prior to seeing the patient today, I have reviewed the triage nursing documentation and vital signs. Clinical staff has updated patient's PMH/PSHx, current medication list, and drug allergies/intolerances to ensure comprehensive history available to assist in medical decision making.   History:   HPI: Phillip Lucas is a 33 y.o. male who presents today with complaints of intermittent cough and hoarse voice quality that started approximately 2 days ago. Patient denies fevers. Cough has been non-productive. He denies that he has experienced any nausea, vomiting, diarrhea, or abdominal pain. He is eating and drinking well. Patient denies any perceived alterations to his sense of taste or smell. Patient presents out of concerns for his personal health following an exposure to someone who tested positive on Friday. Patient's wife is a Immunologist at the hospital. He is concerned about her as she is currently pregnant and they have a 53 month old child at home. He has never been tested for SARS-CoV-2 (novel coronavirus) in the past per his report. Patient has not been vaccinated for influenza this season. In efforts to conservatively manage his symptoms at home, the patient notes that he has used cetirizine, which has not helped to improve his symptoms.    Past Medical History:  Diagnosis Date  . Asthma   . Calculus of kidney   . Obesity     Past Surgical History:  Procedure Laterality Date  . LITHOTRIPSY  2008  . SHOULDER SURGERY Left     Family History  Problem Relation Age of Onset  . Drug abuse Mother   . Coronary artery disease Father   . Diabetes Father   . Heart attack Father 16  . Drug abuse Father   . COPD Maternal Grandfather        smoking and job exposure  . COPD Paternal  Grandmother        smoker  . Aneurysm Paternal Grandmother   . COPD Paternal Grandfather        smoking and job exposure    Social History   Tobacco Use  . Smoking status: Never Smoker  . Smokeless tobacco: Current User    Types: Snuff  Substance Use Topics  . Alcohol use: Yes    Comment: socially  . Drug use: Never    Patient Active Problem List   Diagnosis Date Noted  . Overweight 12/25/2018  . Family history of early CAD 12/25/2018  . Chewing tobacco use 12/25/2018  . Hyperglycemia 12/25/2018  . History of kidney stones 07/11/2013    Home Medications:    No outpatient medications have been marked as taking for the 07/17/19 encounter [facility] Encounter).    Allergies:   Patient has no known allergies.  Review of Systems (ROS): Review of Systems  Constitutional: Negative for fatigue and fever.  HENT: Positive for voice change (hoarse). Negative for congestion, ear pain, postnasal drip, rhinorrhea, sinus pressure, sinus pain, sneezing and sore throat.   Eyes: Negative for pain, discharge and redness.  Respiratory: Positive for cough. Negative for chest tightness and shortness of breath.   Cardiovascular: Negative for chest pain and palpitations.  Gastrointestinal: Negative for abdominal pain, diarrhea, nausea and vomiting.  Musculoskeletal: Negative for arthralgias, back pain, myalgias and neck pain.  Skin: Negative for color change, pallor and rash.  Neurological: Negative for dizziness, syncope, weakness and headaches.  Hematological: Negative for adenopathy.     Vital Signs: Today's Vitals   07/17/19 1530  BP: (!) 134/92  Pulse: 79  Resp: 18  Temp: 98 F (36.7 C)  TempSrc: Oral  SpO2: 98%  Weight: 230 lb (104.3 kg)  Height: 6' (1.829 m)  PainSc: 0-No pain    Physical Exam: Physical Exam  Constitutional: He is oriented to person, place, and time and well-developed, well-nourished, and in no distress.  HENT:  Head: Normocephalic and atraumatic.    Nose: Nose normal.  Mouth/Throat: Uvula is midline. Posterior oropharyngeal erythema present. No oropharyngeal exudate or posterior oropharyngeal edema.  (+) hoarse voice quality  Eyes: Pupils are equal, round, and reactive to light.  Neck: No tracheal tenderness present. No tracheal deviation present.  Cardiovascular: Normal rate, regular rhythm, normal heart sounds and intact distal pulses.  Pulmonary/Chest: Effort normal and breath sounds normal. No stridor.  Mild cough. No SOB or increased WOB. SPO2 98% on RA.  Musculoskeletal:     Cervical back: Normal range of motion and neck supple.  Neurological: He is alert and oriented to person, place, and time. Gait normal.  Skin: Skin is warm and dry. No rash noted. He is not diaphoretic.  Psychiatric: Mood, memory, affect and judgment normal.  Nursing note and vitals reviewed.   Urgent Care Treatments / Results:   Orders Placed This Encounter  Procedures  . Novel Coronavirus, NAA (Hosp order, Send-out to Ref Lab; TAT 18-24 hrs    LABS: PLEASE NOTE: all labs that were ordered this encounter are listed, however only abnormal results are displayed. Labs Reviewed  NOVEL CORONAVIRUS, NAA (HOSP ORDER, SEND-OUT TO REF LAB; TAT 18-24 HRS)    EKG: -None  RADIOLOGY: No results found.  PROCEDURES: Procedures  MEDICATIONS RECEIVED THIS VISIT: Medications - No data to display  PERTINENT CLINICAL COURSE NOTES/UPDATES:   Initial Impression / Assessment and Plan / Urgent Care Course:  Pertinent labs & imaging results that were available during my care of the patient were personally reviewed by me and considered in my medical decision making (see lab/imaging section of note for values and interpretations).  Phillip Lucas is a 33 y.o. male who presents to [facility] today with complaints of Cough and COVID exposure   Patient overall well appearing and in no acute distress today in clinic. Presenting symptoms (see HPI) and exam  as documented above. He presents with symptoms associated with SARS-CoV-2 (novel coronavirus). Discussed typical symptom constellation. Reviewed potential for infection and need for testing. Patient amenable to being tested. SARS-CoV-2 swab collected by certified clinical staff. Discussed variable turn around times associated with testing, as swabs are being processed at [facility], and have been taking between 2-5 days to come back. He was advised to self quarantine, per [facility] DHHS guidelines, until negative results received. These measures are being implemented out of an abundance of caution to prevent transmission and spread during the current SARS-CoV-2 pandemic.  Presenting symptoms consistent with acute viral illness. Until ruled out with confirmatory lab testing, SARS-CoV-2 remains part of the differential. His testing is pending at this time. I discussed with him that his symptoms are felt to be viral in nature, thus antibiotics would not offer him any relief or improve his symptoms any faster than conservative symptomatic management. Discussed supportive care measures at home during acute phase of illness. Patient to rest as much as possible. He was encouraged to ensure adequate hydration (water  and ORS) to prevent dehydration and electrolyte derangements. Patient may use APAP and/or IBU on an as needed basis for pain/fever.    Discussed follow up with primary care physician in 1 week for re-evaluation. I have reviewed the follow up and strict return precautions for any new or worsening symptoms. Patient is aware of symptoms that would be deemed urgent/emergent, and would thus require further evaluation either here or in the emergency department. At the time of discharge, he verbalized understanding and consent with the discharge plan as it was reviewed with him. All questions were fielded by provider and/or clinic staff prior to patient discharge.    Final Clinical Impressions / Urgent Care Diagnoses:    Final diagnoses:  Viral URI with cough  Exposure to COVID-19 virus  Encounter for laboratory testing for COVID-19 virus    New Prescriptions:  [place] Controlled Substance Registry consulted? Not Applicable  No orders of the defined types were placed in this encounter.   Recommended Follow up Care:  Patient encouraged to follow up with the following provider within the specified time frame, or sooner as dictated by the severity of his symptoms. As always, he was instructed that for any urgent/emergent care needs, he should seek care either here or in the emergency department for more immediate evaluation.  Follow-up Information    Bacigalupo, Dionne Bucy, MD In 1 week.   Specialty: Family Medicine Why: General reassessment of symptoms if not improving Contact information: [address] [place] [address] [phone]         NOTE: This note was prepared using Dragon dictation software along with smaller phrase technology. Despite my best ability to proofread, there is the potential that transcriptional errors may still occur from this process, and are completely unintentional.    Karen Kitchens, NP 07/18/19 1600

## 2019-12-31 ENCOUNTER — Encounter: Payer: Self-pay | Admitting: Family Medicine

## 2019-12-31 ENCOUNTER — Other Ambulatory Visit: Payer: Self-pay

## 2019-12-31 ENCOUNTER — Ambulatory Visit (INDEPENDENT_AMBULATORY_CARE_PROVIDER_SITE_OTHER): Payer: BC Managed Care – PPO | Admitting: Family Medicine

## 2019-12-31 VITALS — BP 110/74 | HR 79 | Temp 97.3°F | Resp 16 | Ht 71.0 in | Wt 247.0 lb

## 2019-12-31 DIAGNOSIS — E669 Obesity, unspecified: Secondary | ICD-10-CM

## 2019-12-31 DIAGNOSIS — Z0000 Encounter for general adult medical examination without abnormal findings: Secondary | ICD-10-CM

## 2019-12-31 DIAGNOSIS — Z720 Tobacco use: Secondary | ICD-10-CM | POA: Diagnosis not present

## 2019-12-31 DIAGNOSIS — R739 Hyperglycemia, unspecified: Secondary | ICD-10-CM | POA: Diagnosis not present

## 2019-12-31 DIAGNOSIS — Z6834 Body mass index (BMI) 34.0-34.9, adult: Secondary | ICD-10-CM

## 2019-12-31 DIAGNOSIS — E7800 Pure hypercholesterolemia, unspecified: Secondary | ICD-10-CM | POA: Diagnosis not present

## 2019-12-31 NOTE — Assessment & Plan Note (Addendum)
Discussed cessation of chewing tobacco Patient report that he is having increased stress  Working long hours at [employer], and working on personal projects Wife is pregnant with 2nd baby and they have 36 year old. 3-5 minute discussion regarding the harms of tobacco use, the benefits of cessation, and methods of cessation Discussed that there are medication options to help with cessation Patient is currently precontemplative  Will reassess at next visit

## 2019-12-31 NOTE — Assessment & Plan Note (Signed)
Check A1c Follow up pending lab result

## 2019-12-31 NOTE — Assessment & Plan Note (Signed)
Reviewed last lipid panel Not currently on a statin Recheck FLP and CMP Discussed diet and exercise  

## 2019-12-31 NOTE — Patient Instructions (Signed)
Preventive Care 19-36 Years Old, Male Preventive care refers to lifestyle choices and visits with your health care provider that can promote health and wellness. This includes:  A yearly physical exam. This is also called an annual well check.  Regular dental and eye exams.  Immunizations.  Screening for certain conditions.  Healthy lifestyle choices, such as eating a healthy diet, getting regular exercise, not using drugs or products that contain nicotine and tobacco, and limiting alcohol use. What can I expect for my preventive care visit? Physical exam Your health care provider will check:  Height and weight. These may be used to calculate body mass index (BMI), which is a measurement that tells if you are at a healthy weight.  Heart rate and blood pressure.  Your skin for abnormal spots. Counseling Your health care provider may ask you questions about:  Alcohol, tobacco, and drug use.  Emotional well-being.  Home and relationship well-being.  Sexual activity.  Eating habits.  Work and work Statistician. What immunizations do I need?  Influenza (flu) vaccine  This is recommended every year. Tetanus, diphtheria, and pertussis (Tdap) vaccine  You may need a Td booster every 10 years. Varicella (chickenpox) vaccine  You may need this vaccine if you have not already been vaccinated. Human papillomavirus (HPV) vaccine  If recommended by your health care provider, you may need three doses over 6 months. Measles, mumps, and rubella (MMR) vaccine  You may need at least one dose of MMR. You may also need a second dose. Meningococcal conjugate (MenACWY) vaccine  One dose is recommended if you are 45-76 years old and a Market researcher living in a residence hall, or if you have one of several medical conditions. You may also need additional booster doses. Pneumococcal conjugate (PCV13) vaccine  You may need this if you have certain conditions and were not  previously vaccinated. Pneumococcal polysaccharide (PPSV23) vaccine  You may need one or two doses if you smoke cigarettes or if you have certain conditions. Hepatitis A vaccine  You may need this if you have certain conditions or if you travel or work in places where you may be exposed to hepatitis A. Hepatitis B vaccine  You may need this if you have certain conditions or if you travel or work in places where you may be exposed to hepatitis B. Haemophilus influenzae type b (Hib) vaccine  You may need this if you have certain risk factors. You may receive vaccines as individual doses or as more than one vaccine together in one shot (combination vaccines). Talk with your health care provider about the risks and benefits of combination vaccines. What tests do I need? Blood tests  Lipid and cholesterol levels. These may be checked every 5 years starting at age 17.  Hepatitis C test.  Hepatitis B test. Screening   Diabetes screening. This is done by checking your blood sugar (glucose) after you have not eaten for a while (fasting).  Sexually transmitted disease (STD) testing. Talk with your health care provider about your test results, treatment options, and if necessary, the need for more tests. Follow these instructions at home: Eating and drinking   Eat a diet that includes fresh fruits and vegetables, whole grains, lean protein, and low-fat dairy products.  Take vitamin and mineral supplements as recommended by your health care provider.  Do not drink alcohol if your health care provider tells you not to drink.  If you drink alcohol: ? Limit how much you have to 0-2  drinks a day. ? Be aware of how much alcohol is in your drink. In the [place] one drink equals one 12 oz bottle of beer (355 mL), one 5 oz glass of wine (148 mL), or one 1 oz glass of hard liquor (44 mL). Lifestyle  Take daily care of your teeth and gums.  Stay active. Exercise for at least 30 minutes on 5 or  more days each week.  Do not use any products that contain nicotine or tobacco, such as cigarettes, e-cigarettes, and chewing tobacco. If you need help quitting, ask your health care provider.  If you are sexually active, practice safe sex. Use a condom or other form of protection to prevent STIs (sexually transmitted infections). What's next?  Go to your health care provider once a year for a well check visit.  Ask your health care provider how often you should have your eyes and teeth checked.  Stay up to date on all vaccines. This information is not intended to replace advice given to you by your health care provider. Make sure you discuss any questions you have with your health care provider. Document Revised: 07/06/2018 Document Reviewed: 07/06/2018 Elsevier Patient Education  2020 [employer].

## 2019-12-31 NOTE — Assessment & Plan Note (Signed)
Discussed importance of healthy weight management Discussed diet and exercise  

## 2019-12-31 NOTE — Progress Notes (Signed)
Complete physical exam   Patient: Phillip Lucas   DOB: Jul 22, 1986   34 y.o. Male  MRN: [id number] Visit Date: 12/31/2019  I,Sulibeya S Dimas,acting as a scribe for Lavon Paganini, MD.,have documented all relevant documentation on the behalf of Lavon Paganini, MD,as directed by  Lavon Paganini, MD while in the presence of Lavon Paganini, MD.  Today's healthcare provider: Lavon Paganini, MD   Chief Complaint  Patient presents with   Annual Exam   Subjective    Phillip Lucas is a 34 y.o. male who presents today for a complete physical exam.  He reports consuming a general diet. The patient has a physically strenuous job, but has no regular exercise apart from work.  He generally feels well. He reports sleeping poorly. He does not have additional problems to discuss today.  HPI    Past Medical History:  Diagnosis Date   Asthma    Calculus of kidney    Obesity    Past Surgical History:  Procedure Laterality Date   LITHOTRIPSY  2008   SHOULDER SURGERY Left    Social History   Socioeconomic History   Marital status: Married    Spouse name: Not on file   Number of children: 1   Years of education: Not on file   Highest education level: Not on file  Occupational History   Occupation: Clinical biochemist  Tobacco Use   Smoking status: Never Smoker   Smokeless tobacco: Current User    Types: Snuff  Substance and Sexual Activity   Alcohol use: Yes    Comment: socially   Drug use: Never   Sexual activity: Yes    Partners: Female    Birth control/protection: None  Other Topics Concern   Not on file  Social History Narrative   Not on file   Social Determinants of Health   Financial Resource Strain:    Difficulty of Paying Living Expenses:   Food Insecurity:    Worried About Charity fundraiser in the Last Year:    Arboriculturist in the Last Year:   Transportation Needs:    Film/video editor (Medical):    Lack of  Transportation (Non-Medical):   Physical Activity:    Days of Exercise per Week:    Minutes of Exercise per Session:   Stress:    Feeling of Stress :   Social Connections:    Frequency of Communication with Friends and Family:    Frequency of Social Gatherings with Friends and Family:    Attends Religious Services:    Active Member of Clubs or Organizations:    Attends Music therapist:    Marital Status:   Intimate Partner Violence:    Fear of Current or Ex-Partner:    Emotionally Abused:    Physically Abused:    Sexually Abused:    Family Status  Relation Name Status   Mother  Deceased   Father  Deceased   MGM  Deceased   MGF  Deceased   PGM  Deceased   PGF  Deceased   Son  Alive   Family History  Problem Relation Age of Onset   Drug abuse Mother    Coronary artery disease Father    Diabetes Father    Heart attack Father 43   Drug abuse Father    COPD Maternal Grandfather        smoking and job exposure   COPD Paternal 35  smoker   Aneurysm Paternal Grandmother    COPD Paternal Grandfather        smoking and job exposure   Healthy Son    No Known Allergies  Patient Care Team: Virginia Crews, MD as PCP - General (Family Medicine)   Medications: No outpatient medications prior to visit.   No facility-administered medications prior to visit.    Review of Systems  Constitutional: Negative.   HENT: Negative.   Eyes: Negative.   Respiratory: Negative.   Cardiovascular: Negative.   Gastrointestinal: Negative.   Endocrine: Negative.   Genitourinary: Negative.   Musculoskeletal: Negative.   Skin: Negative.   Allergic/Immunologic: Negative.   Neurological: Negative.   Hematological: Negative.   Psychiatric/Behavioral: Negative.     Last CBC Lab Results  Component Value Date   WBC 4.5 12/27/2018   HGB 15.0 12/27/2018   HCT 43.5 12/27/2018   MCV 96 12/27/2018   MCH 33.0 12/27/2018    RDW 13.2 12/27/2018   PLT 202 71/24/5809   Last metabolic panel Lab Results  Component Value Date   GLUCOSE 93 12/27/2018   NA 139 12/27/2018   K 4.1 12/27/2018   CL 103 12/27/2018   CO2 22 12/27/2018   BUN 18 12/27/2018   CREATININE 0.98 12/27/2018   GFRNONAA 100 12/27/2018   GFRAA 116 12/27/2018   CALCIUM 9.2 12/27/2018   PROT 6.9 12/27/2018   ALBUMIN 4.7 12/27/2018   LABGLOB 2.2 12/27/2018   AGRATIO 2.1 12/27/2018   BILITOT 0.6 12/27/2018   ALKPHOS 50 12/27/2018   AST 22 12/27/2018   ALT 29 12/27/2018   ANIONGAP 5 (L) 11/09/2014   Last lipids Lab Results  Component Value Date   CHOL 194 12/27/2018   HDL 43 12/27/2018   LDLCALC 133 (H) 12/27/2018   TRIG 90 12/27/2018   CHOLHDL 4.5 12/27/2018   Last hemoglobin A1c Lab Results  Component Value Date   HGBA1C 5.3 12/27/2018     Objective    BP 110/74 (BP Location: Left Arm, Patient Position: Sitting, Cuff Size: Large)    Pulse 79    Temp (!) 97.3 F (36.3 C) (Temporal)    Resp 16    Ht 5\' 11"  (1.803 m)    Wt 247 lb (112 kg)    BMI 34.45 kg/m  BP Readings from Last 3 Encounters:  12/31/19 110/74  07/17/19 (!) 134/92   Wt Readings from Last 3 Encounters:  12/31/19 247 lb (112 kg)  07/17/19 230 lb (104.3 kg)  12/25/18 211 lb (95.7 kg)      Physical Exam Vitals reviewed.  Constitutional:      General: He is not in acute distress.    Appearance: Normal appearance. He is well-developed. He is not diaphoretic.  HENT:     Head: Normocephalic and atraumatic.     Right Ear: Tympanic membrane, ear canal and external ear normal.     Left Ear: Tympanic membrane, ear canal and external ear normal.  Eyes:     General: No scleral icterus.    Conjunctiva/sclera: Conjunctivae normal.     Pupils: Pupils are equal, round, and reactive to light.  Neck:     Thyroid: No thyromegaly.  Cardiovascular:     Rate and Rhythm: Normal rate and regular rhythm.     Pulses: Normal pulses.     Heart sounds: Normal heart sounds.  No murmur.  Pulmonary:     Effort: Pulmonary effort is normal. No respiratory distress.     Breath sounds: Normal  breath sounds. No wheezing or rales.  Abdominal:     General: There is no distension.     Palpations: Abdomen is soft.     Tenderness: There is no abdominal tenderness.  Musculoskeletal:        General: No deformity.     Cervical back: Neck supple.     Right lower leg: No edema.     Left lower leg: No edema.  Lymphadenopathy:     Cervical: No cervical adenopathy.  Skin:    General: Skin is warm and dry.     Findings: No rash.  Neurological:     Mental Status: He is alert and oriented to person, place, and time. Mental status is at baseline.     Gait: Gait normal.  Psychiatric:        Mood and Affect: Mood normal.        Behavior: Behavior normal.        Thought Content: Thought content normal.      Depression Screen  PHQ 2/9 Scores 12/31/2019 12/25/2018  PHQ - 2 Score 0 0  PHQ- 9 Score 0 -    No results found for any visits on 12/31/19.  Assessment & Plan    Routine Health Maintenance and Physical Exam  Exercise Activities and Dietary recommendations Goals   None     Immunization History  Administered Date(s) Administered   Janssen (J&J) SARS-COV-2 Vaccination 10/02/2019    Health Maintenance  Topic Date Due   HIV Screening  Never done   TETANUS/TDAP  Never done   INFLUENZA VACCINE  02/24/2020   COVID-19 Vaccine  Completed    Discussed health benefits of physical activity, and encouraged him to engage in regular exercise appropriate for his age and condition.  Problem List Items Addressed This Visit      Other   Obesity    Discussed importance of healthy weight management Discussed diet and exercise       Relevant Orders   Lipid Panel With LDL/HDL Ratio   Hemoglobin A1c   Chewing tobacco use    Discussed cessation of chewing tobacco Patient report that he is having increased stress  Working long hours at [employer], and working on  personal projects Wife is pregnant with 2nd baby and they have 68 year old. 3-5 minute discussion regarding the harms of tobacco use, the benefits of cessation, and methods of cessation Discussed that there are medication options to help with cessation Patient is currently precontemplative  Will reassess at next visit       Hyperglycemia    Check A1c Follow up pending lab result       Relevant Orders   Hemoglobin A1c   Pure hypercholesterolemia    Reviewed last lipid panel Not currently on a statin Recheck FLP and CMP Discussed diet and exercise       Relevant Orders   Lipid Panel With LDL/HDL Ratio   Comprehensive metabolic panel    Other Visit Diagnoses    Annual physical exam    -  Primary   Relevant Orders   Lipid Panel With LDL/HDL Ratio   Comprehensive metabolic panel   HIV Antibody (routine testing w rflx)   Hemoglobin A1c       Return in about 1 year (around 12/30/2020) for CPE.     I, Lavon Paganini, MD, have reviewed all documentation for this visit. The documentation on 12/31/19 for the exam, diagnosis, procedures, and orders are all accurate and complete.   Lavon Paganini  M, MD, MPH [facility]

## 2020-01-01 ENCOUNTER — Telehealth: Payer: Self-pay

## 2020-01-01 LAB — HEMOGLOBIN A1C
Est. average glucose Bld gHb Est-mCnc: 111 mg/dL
Hgb A1c MFr Bld: 5.5 % (ref 4.8–5.6)

## 2020-01-01 LAB — COMPREHENSIVE METABOLIC PANEL
ALT: 35 IU/L (ref 0–44)
AST: 25 IU/L (ref 0–40)
Albumin/Globulin Ratio: 1.8 (ref 1.2–2.2)
Albumin: 4.6 g/dL (ref 4.0–5.0)
Alkaline Phosphatase: 55 IU/L (ref 48–121)
BUN/Creatinine Ratio: 14 (ref 9–20)
BUN: 16 mg/dL (ref 6–20)
Bilirubin Total: 0.4 mg/dL (ref 0.0–1.2)
CO2: 20 mmol/L (ref 20–29)
Calcium: 9.2 mg/dL (ref 8.7–10.2)
Chloride: 104 mmol/L (ref 96–106)
Creatinine, Ser: 1.13 mg/dL (ref 0.76–1.27)
GFR calc Af Amer: 97 mL/min/{1.73_m2} (ref >59)
GFR calc non Af Amer: 84 mL/min/{1.73_m2} (ref >59)
Globulin, Total: 2.5 g/dL (ref 1.5–4.5)
Glucose: 149 mg/dL — ABNORMAL HIGH (ref 65–99)
Potassium: 4.1 mmol/L (ref 3.5–5.2)
Sodium: 139 mmol/L (ref 134–144)
Total Protein: 7.1 g/dL (ref 6.0–8.5)

## 2020-01-01 LAB — LIPID PANEL WITH LDL/HDL RATIO
Cholesterol, Total: 198 mg/dL (ref 100–199)
HDL: 37 mg/dL — ABNORMAL LOW (ref >39)
LDL Chol Calc (NIH): 129 mg/dL — ABNORMAL HIGH (ref 0–99)
LDL/HDL Ratio: 3.5 ratio (ref 0.0–3.6)
Triglycerides: 180 mg/dL — ABNORMAL HIGH (ref 0–149)
VLDL Cholesterol Cal: 32 mg/dL (ref 5–40)

## 2020-01-01 LAB — HIV ANTIBODY (ROUTINE TESTING W REFLEX): HIV Screen 4th Generation wRfx: NONREACTIVE

## 2020-01-01 NOTE — Telephone Encounter (Signed)
[employer]   Result Notes and Comments to Patient Comment seen by patient Phillip Lucas on 01/01/2020 9:27 AM EDT

## 2020-01-01 NOTE — Telephone Encounter (Signed)
-----   Message from Virginia Crews, MD sent at 01/01/2020  8:54 AM EDT ----- Normal labs, except cholesterol is slightly elevated.  No need for medication at this time, but I do recommend diet low in saturated fat and regular exercise - 30 min at least 5 times per week

## 2020-03-17 ENCOUNTER — Other Ambulatory Visit: Payer: Self-pay

## 2020-03-17 ENCOUNTER — Ambulatory Visit: Payer: BC Managed Care – PPO | Admitting: Urology

## 2020-03-17 ENCOUNTER — Encounter: Payer: Self-pay | Admitting: Urology

## 2020-03-17 VITALS — BP 126/8 | HR 71 | Ht 70.0 in | Wt 242.6 lb

## 2020-03-17 DIAGNOSIS — Z3009 Encounter for other general counseling and advice on contraception: Secondary | ICD-10-CM | POA: Diagnosis not present

## 2020-03-17 NOTE — Patient Instructions (Signed)

## 2020-03-17 NOTE — Progress Notes (Signed)
03/17/2020 8:37 AM   [name] October 09, 1985 [id number]  Referring provider: Virginia Crews, [address] [place],  [place] [address]  Chief Complaint  Patient presents with  . VAS Consult    HPI: Anuj Summons is a 34 y.o. male who presents for vasectomy counseling.   Married with 2 children  Denies chronic scrotal pain or history epididymitis/orchitis  No prior hernia/pelvic surgery  I saw in 2014 for ureteral calculus status post shockwave lithotripsy  No recurrent renal colic  No history of bleeding or clotting disorders   PMH: Past Medical History:  Diagnosis Date  . Asthma   . Calculus of kidney   . Obesity     Surgical History: Past Surgical History:  Procedure Laterality Date  . LITHOTRIPSY  2008  . SHOULDER SURGERY Left     Home Medications:  Allergies as of 03/17/2020   No Known Allergies     Medication List    as of March 17, 2020  8:37 AM   You have not been prescribed any medications.     Allergies: No Known Allergies  Family History: Family History  Problem Relation Age of Onset  . Drug abuse Mother   . Coronary artery disease Father   . Diabetes Father   . Heart attack Father 52  . Drug abuse Father   . COPD Maternal Grandfather        smoking and job exposure  . COPD Paternal Grandmother        smoker  . Aneurysm Paternal Grandmother   . COPD Paternal Grandfather        smoking and job exposure  . Healthy Son     Social History:  reports that he has never smoked. His smokeless tobacco use includes snuff. He reports current alcohol use. He reports that he does not use drugs.   Physical Exam: There were no vitals taken for this visit.  Constitutional:  Alert and oriented, No acute distress. HEENT: [place] AT, moist mucus membranes.  Trachea midline, no masses. Cardiovascular: No clubbing, cyanosis, or edema. Respiratory: Normal respiratory effort, no increased work of breathing. GU: Phallus  without lesions, testes descended bilaterally without masses or tenderness, spermatic cord/epididymis palpably normal bilaterally.  Vasa easily palpable Skin: No rashes, bruises or suspicious lesions. Neurologic: Grossly intact, no focal deficits, moving all 4 extremities. Psychiatric: Normal mood and affect.   Assessment & Plan:    1. Undesired fertility  We had a long discussion about vasectomy. We specifically discussed the procedure, recovery and the risks, benefits and alternatives of vasectomy. I explained that the procedure entails removal of a segment of each vas deferens, each of which conducts sperm, and that the purpose of this procedure is to cause sterility (inability to produce children or cause pregnancy). Vasectomy is intended to be permanent and irreversible form of contraception. Options for fertility after vasectomy include vasectomy reversal, or sperm retrieval with in vitro fertilization. These options are not always successful, and they may be expensive. We discussed reversible forms of birth control such as condoms, IUD or diaphragms, as well as the option of freezing sperm in a sperm bank prior to the vasectomy procedure. We discussed the importance of avoiding strenuous exercise for four days after vasectomy, and the importance of refraining from any form of ejaculation for seven days after vasectomy. I explained that vasectomy does not produce immediate sterility so another form of contraceptive must be used until sterility is assured by having semen  checked for sperm. Thus, a post vasectomy semen analysis is necessary to confirm sterility. Rarely, vasectomy must be repeated. We discussed the approximately 1 in 2,000 risk of pregnancy after vasectomy for men who have post-vasectomy semen analysis showing absent sperm or rare non-motile sperm. Typical side effects include a small amount of oozing blood, some discomfort and mild swelling in the area of incision.  Vasectomy does not  affect sexual performance, function, please, sensation, interest, desire, satisfaction, penile erection, volume of semen or ejaculation. Other rare risks include allergy or adverse reaction to an anesthetic, testicular atrophy, hematoma, infection/abscess, prolonged tenderness of the vas deferens, pain, swelling, painful nodule or scar (called sperm granuloma) or epididymtis. We discussed chronic testicular pain syndrome. This has been reported to occur in as many as 1-2% of men and may be permanent. This can be treated with medication, small procedures or (rarely) surgery.  Desires to schedule vasectomy  Rx Valium offered as a preprocedure anxiolytic and he indicated he would call back if he desires    Abbie Sons, [place] [address] [place], [place] [address] [phone]

## 2020-04-14 ENCOUNTER — Encounter: Payer: Self-pay | Admitting: Urology

## 2020-04-25 NOTE — Progress Notes (Signed)
Vasectomy Procedure Note  Indications: The patient is a 34 y.o. male who presents today for elective sterilization.  He has been consented for the procedure.  He is aware of the risks and benefits.  He had no additional questions.  He agrees to proceed.  He denies any other significant change since his last visit.  Pre-operative Diagnosis: Elective sterilization  Post-operative Diagnosis: Elective sterilization  Premedication: N/A  Surgeon: Nicki Reaper C. Honest Safranek, M.D  Description: The patient was prepped and draped in the standard fashion.  The right vas deferens was identified and brought superiorly to the anterior scrotal skin.  The skin and vas was then anesthetized utilizing 8 ml 1% lidocaine.  A small stab incision was made and spread with the vas dissector.  The vas was grasped utilizing the vas clamp and elevated out of the incision.  The vas was dissected free from surrounding tissue and vessels and an ~1 cm segment was excised.  The vas lumens were cauterized utilizing electrocautery.  The distal segment was buried in the surrounding sheath with a 3-0 chromic suture.  No significant bleeding was observed.  The vas ends were then dropped back into the hemiscrotum.  The skin was closed with hemostatic pressure.  An identical procedure was performed on the contralateral side.  Clean dry gauze was applied to the incision sites.  The patient tolerated the procedure well.  Complications:None  Recommendations: 1.  No lifting greater than 10 pounds or strenuousactivity for 1 week. 2.  Scrotal support for 1 week. 3.  Shower only for 1 week; may shower in the morning 4.  May resume intercourse in one week if no significant discomfort.  Continue alternate contraception for 12 weeks.  5.  Call for significant pain, swelling, redness, drainage or fever greater than 100.5. 6.  Rx hydrocodone/APAP 5/325 1-2 every 6 hours as needed for pain. 7.  Follow-up semen analysis in 12 weeks.

## 2020-04-28 ENCOUNTER — Ambulatory Visit (INDEPENDENT_AMBULATORY_CARE_PROVIDER_SITE_OTHER): Payer: BC Managed Care – PPO | Admitting: Urology

## 2020-04-28 ENCOUNTER — Other Ambulatory Visit: Payer: Self-pay

## 2020-04-28 ENCOUNTER — Encounter: Payer: Self-pay | Admitting: Urology

## 2020-04-28 VITALS — BP 132/77 | HR 85 | Ht 71.0 in | Wt 242.0 lb

## 2020-04-28 DIAGNOSIS — Z302 Encounter for sterilization: Secondary | ICD-10-CM | POA: Diagnosis not present

## 2020-04-28 MED ORDER — HYDROCODONE-ACETAMINOPHEN 5-325 MG PO TABS: 1.0000 | ORAL_TABLET | Freq: Four times a day (QID) | ORAL | 0 refills | Status: DC | PRN

## 2020-04-28 NOTE — Patient Instructions (Signed)

## 2020-07-22 ENCOUNTER — Other Ambulatory Visit: Payer: Self-pay

## 2020-07-28 ENCOUNTER — Other Ambulatory Visit: Payer: Self-pay

## 2020-07-28 ENCOUNTER — Other Ambulatory Visit: Payer: BC Managed Care – PPO

## 2020-07-28 DIAGNOSIS — Z9852 Vasectomy status: Secondary | ICD-10-CM

## 2020-07-29 LAB — POST-VAS SPERM EVALUATION,QUAL: Volume: 2.4 mL

## 2020-07-30 ENCOUNTER — Encounter: Payer: Self-pay | Admitting: Urology

## 2021-01-05 ENCOUNTER — Encounter: Payer: Self-pay | Admitting: Family Medicine

## 2021-03-13 NOTE — Progress Notes (Signed)
Complete physical exam   Patient: Phillip Lucas   DOB: [birth year]   35 y.o. Male  MRN: [id number] Visit Date: 03/16/2021  Today's healthcare provider: Lavon Paganini, MD   Chief Complaint  Patient presents with   Complete Physical Exam   Subjective    Phillip Lucas is a 35 y.o. male who presents today for a complete physical exam.  He reports consuming a  keto  diet. Home exercise routine includes weight lifting. He generally feels well. He reports sleeping well. He does have additional problems to discuss today.  HPI HPI   Last one 12/31/2019 Last edited by Beverlee Nims, CMA on 03/16/2021 10:58 AM.       Small knot on interior forearm (right) been there for a year. No pain, no changes in size, rash or anything unusual from it. Does not go in if pressed on but moves   Past Medical History:  Diagnosis Date   Asthma    Calculus of kidney    Obesity    Past Surgical History:  Procedure Laterality Date   LITHOTRIPSY  2008   SHOULDER SURGERY Left    VASECTOMY     Social History   Socioeconomic History   Marital status: Married    Spouse name: Not on file   Number of children: 1   Years of education: Not on file   Highest education level: Not on file  Occupational History   Occupation: Clinical biochemist  Tobacco Use   Smoking status: Never   Smokeless tobacco: Current    Types: Snuff  Vaping Use   Vaping Use: Never used  Substance and Sexual Activity   Alcohol use: Yes    Comment: socially   Drug use: Never   Sexual activity: Yes    Partners: Female    Birth control/protection: None  Other Topics Concern   Not on file  Social History Narrative   Not on file   Social Determinants of Health   Financial Resource Strain: Not on file  Food Insecurity: Not on file  Transportation Needs: Not on file  Physical Activity: Not on file  Stress: Not on file  Social Connections: Not on file  Intimate Partner Violence: Not on file   Family Status   Relation Name Status   Mother  Deceased   Father  Deceased   MGM  Deceased   MGF  Deceased   PGM  Deceased   PGF  Deceased   Son  Alive   Family History  Problem Relation Age of Onset   Drug abuse Mother    Coronary artery disease Father    Diabetes Father    Heart attack Father 106   Drug abuse Father    COPD Maternal Grandfather        smoking and job exposure   COPD Paternal Grandmother        smoker   Aneurysm Paternal Grandmother    COPD Paternal Grandfather        smoking and job exposure   Healthy Son    No Known Allergies  Patient Care Team: Virginia Crews, MD as PCP - General (Family Medicine)   Medications: Outpatient Medications Prior to Visit  Medication Sig   [DISCONTINUED] HYDROcodone-acetaminophen (NORCO/VICODIN) 5-325 MG tablet Take 1 tablet by mouth every 6 (six) hours as needed for moderate pain. (Patient not taking: Reported on 03/16/2021)   No facility-administered medications prior to visit.    Review of Systems  All other systems  reviewed and are negative.  Last CBC Lab Results  Component Value Date   WBC 4.5 12/27/2018   HGB 15.0 12/27/2018   HCT 43.5 12/27/2018   MCV 96 12/27/2018   MCH 33.0 12/27/2018   RDW 13.2 12/27/2018   PLT 202 123456   Last metabolic panel Lab Results  Component Value Date   GLUCOSE 149 (H) 12/31/2019   NA 139 12/31/2019   K 4.1 12/31/2019   CL 104 12/31/2019   CO2 20 12/31/2019   BUN 16 12/31/2019   CREATININE 1.13 12/31/2019   GFRNONAA 84 12/31/2019   GFRAA 97 12/31/2019   CALCIUM 9.2 12/31/2019   PROT 7.1 12/31/2019   ALBUMIN 4.6 12/31/2019   LABGLOB 2.5 12/31/2019   AGRATIO 1.8 12/31/2019   BILITOT 0.4 12/31/2019   ALKPHOS 55 12/31/2019   AST 25 12/31/2019   ALT 35 12/31/2019   ANIONGAP 5 (L) 11/09/2014   Last lipids Lab Results  Component Value Date   CHOL 198 12/31/2019   HDL 37 (L) 12/31/2019   LDLCALC 129 (H) 12/31/2019   TRIG 180 (H) 12/31/2019   CHOLHDL 4.5 12/27/2018    Last hemoglobin A1c Lab Results  Component Value Date   HGBA1C 5.5 12/31/2019      Objective    BP 139/82 (BP Location: Left Arm, Patient Position: Sitting, Cuff Size: Large)   Pulse 74   Temp 98.4 F (36.9 C) (Oral)   Ht '5\' 10"'$  (1.778 m)   Wt 231 lb 3.2 oz (104.9 kg)   SpO2 97%   BMI 33.17 kg/m  BP Readings from Last 3 Encounters:  03/16/21 139/82  04/28/20 132/77  03/17/20 (!) 126/8   Wt Readings from Last 3 Encounters:  03/16/21 231 lb 3.2 oz (104.9 kg)  04/28/20 242 lb (109.8 kg)  03/17/20 242 lb 9.6 oz (110 kg)      Physical Exam Vitals reviewed.  Constitutional:      General: He is not in acute distress.    Appearance: Normal appearance. He is well-developed. He is not diaphoretic.  HENT:     Head: Normocephalic and atraumatic.     Right Ear: Tympanic membrane, ear canal and external ear normal.     Left Ear: Tympanic membrane, ear canal and external ear normal.     Nose: Nose normal.     Mouth/Throat:     Mouth: Mucous membranes are moist.     Pharynx: Oropharynx is clear. No oropharyngeal exudate.  Eyes:     General: No scleral icterus.    Conjunctiva/sclera: Conjunctivae normal.     Pupils: Pupils are equal, round, and reactive to light.  Neck:     Thyroid: No thyromegaly.  Cardiovascular:     Rate and Rhythm: Normal rate and regular rhythm.     Pulses: Normal pulses.     Heart sounds: Normal heart sounds. No murmur heard. Pulmonary:     Effort: Pulmonary effort is normal. No respiratory distress.     Breath sounds: Normal breath sounds. No wheezing or rales.  Abdominal:     General: There is no distension.     Palpations: Abdomen is soft.     Tenderness: There is no abdominal tenderness.  Musculoskeletal:        General: No deformity.     Cervical back: Neck supple.     Right lower leg: No edema.     Left lower leg: No edema.  Lymphadenopathy:     Cervical: No cervical adenopathy.  Skin:  General: Skin is warm and dry.     Findings:  No rash.     Comments: Small, mobile lipoma of R forearm  Neurological:     Mental Status: He is alert and oriented to person, place, and time. Mental status is at baseline.     Sensory: No sensory deficit.     Motor: No weakness.     Gait: Gait normal.  Psychiatric:        Mood and Affect: Mood normal.        Behavior: Behavior normal.        Thought Content: Thought content normal.     Last depression screening scores PHQ 2/9 Scores 03/16/2021 12/31/2019 12/25/2018  PHQ - 2 Score 0 0 0  PHQ- 9 Score 0 0 -   Last fall risk screening Fall Risk  03/16/2021  Falls in the past year? 0  Number falls in past yr: 0  Injury with Fall? 0  Risk for fall due to : No Fall Risks  Follow up -   Last Audit-C alcohol use screening Alcohol Use Disorder Test (AUDIT) 03/16/2021  1. How often do you have a drink containing alcohol? 3  2. How many drinks containing alcohol do you have on a typical day when you are drinking? 0  3. How often do you have six or more drinks on one occasion? 0  AUDIT-C Score 3   A score of 3 or more in women, and 4 or more in men indicates increased risk for alcohol abuse, EXCEPT if all of the points are from question 1   No results found for any visits on 03/16/21.  Assessment & Plan    Routine Health Maintenance and Physical Exam  Exercise Activities and Dietary recommendations  Goals   None     Immunization History  Administered Date(s) Administered   Janssen (J&J) SARS-COV-2 Vaccination 10/02/2019   [place]ston Sars-Covid-2 Vaccination 02/04/2021    Health Maintenance  Topic Date Due   Hepatitis C Screening  Never done   TETANUS/TDAP  Never done   INFLUENZA VACCINE  02/23/2021   COVID-19 Vaccine  Completed   HIV Screening  Completed   Pneumococcal Vaccine 70-56 Years old  Aged Out   HPV VACCINES  Aged Out    Discussed health benefits of physical activity, and encouraged him to engage in regular exercise appropriate for his age and condition.  Problem  List Items Addressed This Visit       Other   Obesity   Relevant Orders   Comprehensive metabolic panel   Hyperglycemia   Relevant Orders   Comprehensive metabolic panel   Hemoglobin A1c   Pure hypercholesterolemia   Relevant Orders   Comprehensive metabolic panel   Lipid Panel With LDL/HDL Ratio   Lipoma of right upper extremity   Other Visit Diagnoses     Annual physical exam    -  Primary   Relevant Orders   Comprehensive metabolic panel   Lipid Panel With LDL/HDL Ratio   Encounter for hepatitis C screening test for low risk patient       Relevant Orders   Hepatitis C antibody      - continue to monitor lipoma   Return in about 1 year (around 03/16/2022) for CPE.     I, Lavon Paganini, MD, have reviewed all documentation for this visit. The documentation on 03/16/21 for the exam, diagnosis, procedures, and orders are all accurate and complete.   Virginia Crews, MD, MPH [facility]

## 2021-03-16 ENCOUNTER — Other Ambulatory Visit: Payer: Self-pay

## 2021-03-16 ENCOUNTER — Encounter: Payer: Self-pay | Admitting: Family Medicine

## 2021-03-16 ENCOUNTER — Ambulatory Visit (INDEPENDENT_AMBULATORY_CARE_PROVIDER_SITE_OTHER): Payer: BC Managed Care – PPO | Admitting: Family Medicine

## 2021-03-16 VITALS — BP 139/82 | HR 74 | Temp 98.4°F | Ht 70.0 in | Wt 231.2 lb

## 2021-03-16 DIAGNOSIS — E7800 Pure hypercholesterolemia, unspecified: Secondary | ICD-10-CM | POA: Diagnosis not present

## 2021-03-16 DIAGNOSIS — E669 Obesity, unspecified: Secondary | ICD-10-CM

## 2021-03-16 DIAGNOSIS — Z0000 Encounter for general adult medical examination without abnormal findings: Secondary | ICD-10-CM

## 2021-03-16 DIAGNOSIS — Z6833 Body mass index (BMI) 33.0-33.9, adult: Secondary | ICD-10-CM

## 2021-03-16 DIAGNOSIS — R739 Hyperglycemia, unspecified: Secondary | ICD-10-CM

## 2021-03-16 DIAGNOSIS — Z1159 Encounter for screening for other viral diseases: Secondary | ICD-10-CM

## 2021-03-16 DIAGNOSIS — D1721 Benign lipomatous neoplasm of skin and subcutaneous tissue of right arm: Secondary | ICD-10-CM

## 2021-03-17 LAB — LIPID PANEL WITH LDL/HDL RATIO
Cholesterol, Total: 188 mg/dL (ref 100–199)
HDL: 51 mg/dL (ref >39)
LDL Chol Calc (NIH): 115 mg/dL — ABNORMAL HIGH (ref 0–99)
LDL/HDL Ratio: 2.3 ratio (ref 0.0–3.6)
Triglycerides: 124 mg/dL (ref 0–149)
VLDL Cholesterol Cal: 22 mg/dL (ref 5–40)

## 2021-03-17 LAB — HEPATITIS C ANTIBODY: Hep C Virus Ab: <0.1 s/co ratio (ref 0.0–0.9)

## 2021-03-17 LAB — COMPREHENSIVE METABOLIC PANEL
ALT: 25 IU/L (ref 0–44)
AST: 14 IU/L (ref 0–40)
Albumin/Globulin Ratio: 2.1 (ref 1.2–2.2)
Albumin: 4.6 g/dL (ref 4.0–5.0)
Alkaline Phosphatase: 54 IU/L (ref 44–121)
BUN/Creatinine Ratio: 12 (ref 9–20)
BUN: 11 mg/dL (ref 6–20)
Bilirubin Total: 0.4 mg/dL (ref 0.0–1.2)
CO2: 23 mmol/L (ref 20–29)
Calcium: 9.7 mg/dL (ref 8.7–10.2)
Chloride: 105 mmol/L (ref 96–106)
Creatinine, Ser: 0.94 mg/dL (ref 0.76–1.27)
Globulin, Total: 2.2 g/dL (ref 1.5–4.5)
Glucose: 91 mg/dL (ref 65–99)
Potassium: 4.2 mmol/L (ref 3.5–5.2)
Sodium: 142 mmol/L (ref 134–144)
Total Protein: 6.8 g/dL (ref 6.0–8.5)
eGFR: 108 mL/min/{1.73_m2} (ref >59)

## 2021-03-17 LAB — HEMOGLOBIN A1C
Est. average glucose Bld gHb Est-mCnc: 108 mg/dL
Hgb A1c MFr Bld: 5.4 % (ref 4.8–5.6)

## 2021-04-10 ENCOUNTER — Telehealth: Payer: Self-pay

## 2021-04-10 NOTE — Telephone Encounter (Signed)
Copied from [place] [id number]. Topic: Complaint - Billing/Coding >> Apr 10, 2021  4:25 PM Yvette Rack wrote: DOS: 03/16/21 Details of complaint: Pt stated he received a letter stating his insurance was not applied therefore he is now being billed for the CPE.  How would the patient like to see this issue resolved? Pt requests that the billing be corrected and filed with his insurance. Pt requests call back. Cb# [phone]   Route to Engineer, building services.

## 2021-04-10 NOTE — Telephone Encounter (Signed)
Copied from [place] [id number]. Topic: Complaint - Billing/Coding >> Apr 10, 2021  4:25 PM Yvette Rack wrote: DOS: 03/16/21 Details of complaint: Pt stated he received a letter stating his insurance was not applied therefore he is now being billed for the CPE.  How would the patient like to see this issue resolved? Pt requests that the billing be corrected and filed with his insurance. Pt requests call back. Cb# [phone]   Route to Engineer, building services.

## 2021-04-13 NOTE — Telephone Encounter (Signed)
Spoke with patient and told him I would get it refiled with correct ID# to [facility].

## 2021-07-26 HISTORY — PX: APPENDECTOMY: SHX54

## 2021-11-26 ENCOUNTER — Ambulatory Visit: Payer: Self-pay

## 2021-11-26 NOTE — Telephone Encounter (Signed)
Noted  

## 2021-11-26 NOTE — Telephone Encounter (Signed)
?  Chief Complaint: Abdominal pain  ?Symptoms: Extreme abdominal pain for 5 + hours ?Frequency: 5 hours ?Pertinent Negatives: Patient denies fever, vomiting , diarrhea ?Disposition: '[x]'$ ED /'[]'$ Urgent Care (no appt availability in office) / '[]'$ Appointment(In office/virtual)/ '[]'$  [facility]/ '[]'$ Home Care/ '[]'$ Refused Recommended Disposition /'[]'$ [facility]/ '[]'$  Follow-up with PCP ?Additional Notes: Pt has had extreme abdominal pain for 5 + hours. Pain is concentrated in right lower quadrant.  ?Reason for Disposition ? [1] SEVERE pain (e.g., excruciating) AND [2] present > 1 hour ? ?Answer Assessment - Initial Assessment Questions ?1. LOCATION: "Where does it hurt?"  ?    Entire abdomen - concentrated on right lower quad ?2. RADIATION: "Does the pain shoot anywhere else?" (e.g., chest, back) ?    no ?3. ONSET: "When did the pain begin?" (Minutes, hours or days ago)  ?    5 hours ?4. SUDDEN: "Gradual or sudden onset?" ?    Fairly quickly ?5. PATTERN "Does the pain come and go, or is it constant?" ?   - If constant: "Is it getting better, staying the same, or worsening?"  ?    (Note: Constant means the pain never goes away completely; most serious pain is constant and it progresses)  ?   - If intermittent: "How long does it last?" "Do you have pain now?" ?    (Note: Intermittent means the pain goes away completely between bouts) ?    constant ?6. SEVERITY: "How bad is the pain?"  (e.g., Scale 1-10; mild, moderate, or severe) ?   - MILD (1-3): doesn't interfere with normal activities, abdomen soft and not tender to touch  ?   - MODERATE (4-7): interferes with normal activities or awakens from sleep, abdomen tender to touch  ?   - SEVERE (8-10): excruciating pain, doubled over, unable to do any normal activities   ?    10/10 ?7. RECURRENT SYMPTOM: "Have you ever had this type of stomach pain before?" If Yes, ask: "When was the last time?" and "What happened that time?"  ?    no ?8. CAUSE: "What do you think  is causing the stomach pain?" ?    unsure ?9. RELIEVING/AGGRAVATING FACTORS: "What makes it better or worse?" (e.g., movement, antacids, bowel movement) ?    ?10. OTHER SYMPTOMS: "Do you have any other symptoms?" (e.g., back pain, diarrhea, fever, urination pain, vomiting) ?      no ? ?Protocols used: Abdominal Pain - Male-A-AH ? ?

## 2022-03-19 ENCOUNTER — Encounter: Payer: Self-pay | Admitting: Family Medicine

## 2022-03-22 ENCOUNTER — Ambulatory Visit (INDEPENDENT_AMBULATORY_CARE_PROVIDER_SITE_OTHER): Payer: BC Managed Care – PPO | Admitting: Family Medicine

## 2022-03-22 ENCOUNTER — Encounter: Payer: Self-pay | Admitting: Family Medicine

## 2022-03-22 VITALS — BP 132/87 | HR 63 | Temp 98.4°F | Resp 16 | Ht 70.0 in | Wt 219.6 lb

## 2022-03-22 DIAGNOSIS — Z720 Tobacco use: Secondary | ICD-10-CM | POA: Diagnosis not present

## 2022-03-22 DIAGNOSIS — Z0000 Encounter for general adult medical examination without abnormal findings: Secondary | ICD-10-CM | POA: Diagnosis not present

## 2022-03-22 DIAGNOSIS — Z6833 Body mass index (BMI) 33.0-33.9, adult: Secondary | ICD-10-CM

## 2022-03-22 DIAGNOSIS — E7800 Pure hypercholesterolemia, unspecified: Secondary | ICD-10-CM

## 2022-03-22 DIAGNOSIS — Z23 Encounter for immunization: Secondary | ICD-10-CM

## 2022-03-22 DIAGNOSIS — E669 Obesity, unspecified: Secondary | ICD-10-CM

## 2022-03-22 DIAGNOSIS — Z8249 Family history of ischemic heart disease and other diseases of the circulatory system: Secondary | ICD-10-CM | POA: Diagnosis not present

## 2022-03-22 NOTE — Progress Notes (Signed)
BP 132/87 (BP Location: Right Arm, Patient Position: Sitting, Cuff Size: Large)   Pulse 63   Temp 98.4 F (36.9 C) (Oral)   Resp 16   Ht $R'5\' 10"'cj$  (1.778 m)   Wt 219 lb 9.6 oz (99.6 kg)   BMI 31.51 kg/m    Subjective:    Patient ID: Phillip Lucas, male    DOB: Jul 04, 1986, 36 y.o.   MRN: [id number]  HPI: Phillip Lucas is a 36 y.o. male presenting on 03/22/2022 for comprehensive medical examination. Current medical complaints include:none  He currently lives with: wife, kids Interim Problems from his last visit: no  Depression Screen done today and results listed below:     03/22/2022    9:01 AM 03/16/2021   11:10 AM 12/31/2019    9:25 AM 12/25/2018    9:17 AM  Depression screen PHQ 2/9  Decreased Interest 0 0 0 0  Down, Depressed, Hopeless 0 0 0 0  PHQ - 2 Score 0 0 0 0  Altered sleeping  0 0   Tired, decreased energy  0 0   Change in appetite  0 0   Feeling bad or failure about yourself   0 0   Trouble concentrating  0 0   Moving slowly or fidgety/restless  0 0   Suicidal thoughts  0 0   PHQ-9 Score  0 0   Difficult doing work/chores  Not difficult at all Not difficult at all     Past Medical History:  Past Medical History:  Diagnosis Date   Asthma    Calculus of kidney    Obesity     Surgical History:  Past Surgical History:  Procedure Laterality Date   APPENDECTOMY  2023   LITHOTRIPSY  2008   SHOULDER SURGERY Left    VASECTOMY      Medications:  No current outpatient medications on file prior to visit.   No current facility-administered medications on file prior to visit.    Allergies:  No Known Allergies  Social History:  Social History   Socioeconomic History   Marital status: Married    Spouse name: Not on file   Number of children: 1   Years of education: Not on file   Highest education level: Not on file  Occupational History   Occupation: Clinical biochemist  Tobacco Use   Smoking status: Never   Smokeless tobacco: Current    Types: Snuff   Vaping Use   Vaping Use: Never used  Substance and Sexual Activity   Alcohol use: Yes    Comment: socially   Drug use: Never   Sexual activity: Yes    Partners: Female    Birth control/protection: None  Other Topics Concern   Not on file  Social History Narrative   Not on file   Social Determinants of Health   Financial Resource Strain: Not on file  Food Insecurity: Not on file  Transportation Needs: Not on file  Physical Activity: Not on file  Stress: Not on file  Social Connections: Not on file  Intimate Partner Violence: Not on file   Social History   Tobacco Use  Smoking Status Never  Smokeless Tobacco Current   Types: Snuff   Social History   Substance and Sexual Activity  Alcohol Use Yes   Comment: socially    Family History:  Family History  Problem Relation Age of Onset   Drug abuse Mother    Coronary artery disease Father    Diabetes Father  Heart attack Father 61   Drug abuse Father    COPD Maternal Grandfather        smoking and job exposure   COPD Paternal Grandmother        smoker   Aneurysm Paternal Grandmother    COPD Paternal Grandfather        smoking and job exposure   Healthy Son     Past medical history, surgical history, medications, allergies, family history and social history reviewed with patient today and changes made to appropriate areas of the chart.      Objective:    BP 132/87 (BP Location: Right Arm, Patient Position: Sitting, Cuff Size: Large)   Pulse 63   Temp 98.4 F (36.9 C) (Oral)   Resp 16   Ht $R'5\' 10"'MF$  (1.778 m)   Wt 219 lb 9.6 oz (99.6 kg)   BMI 31.51 kg/m   Wt Readings from Last 3 Encounters:  03/22/22 219 lb 9.6 oz (99.6 kg)  03/16/21 231 lb 3.2 oz (104.9 kg)  04/28/20 242 lb (109.8 kg)    Physical Exam Vitals reviewed.  Constitutional:      Appearance: Normal appearance.  HENT:     Head: Normocephalic and atraumatic.     Right Ear: Tympanic membrane, ear canal and external ear normal.     Left  Ear: Tympanic membrane, ear canal and external ear normal.     Nose: Nose normal.     Mouth/Throat:     Mouth: Mucous membranes are moist.     Pharynx: Oropharynx is clear.  Eyes:     Extraocular Movements: Extraocular movements intact.     Pupils: Pupils are equal, round, and reactive to light.  Cardiovascular:     Rate and Rhythm: Normal rate and regular rhythm.     Heart sounds: Normal heart sounds. No murmur heard. Pulmonary:     Effort: Pulmonary effort is normal.     Breath sounds: Normal breath sounds.  Abdominal:     General: Bowel sounds are normal.     Palpations: Abdomen is soft.     Tenderness: There is no abdominal tenderness.  Musculoskeletal:        General: Normal range of motion.     Right lower leg: No edema.     Left lower leg: No edema.  Lymphadenopathy:     Cervical: No cervical adenopathy.  Skin:    General: Skin is warm and dry.  Neurological:     Mental Status: He is alert and oriented to person, place, and time. Mental status is at baseline.     Gait: Gait normal.  Psychiatric:        Mood and Affect: Mood normal.        Behavior: Behavior normal.     Results for orders placed or performed in visit on 03/16/21  Comprehensive metabolic panel  Result Value Ref Range   Glucose 91 65 - 99 mg/dL   BUN 11 6 - 20 mg/dL   Creatinine, Ser 0.94 0.76 - 1.27 mg/dL   eGFR 108 >59 mL/min/1.73   BUN/Creatinine Ratio 12 9 - 20   Sodium 142 134 - 144 mmol/L   Potassium 4.2 3.5 - 5.2 mmol/L   Chloride 105 96 - 106 mmol/L   CO2 23 20 - 29 mmol/L   Calcium 9.7 8.7 - 10.2 mg/dL   Total Protein 6.8 6.0 - 8.5 g/dL   Albumin 4.6 4.0 - 5.0 g/dL   Globulin, Total 2.2 1.5 - 4.5 g/dL  Albumin/Globulin Ratio 2.1 1.2 - 2.2   Bilirubin Total 0.4 0.0 - 1.2 mg/dL   Alkaline Phosphatase 54 44 - 121 IU/L   AST 14 0 - 40 IU/L   ALT 25 0 - 44 IU/L  Lipid Panel With LDL/HDL Ratio  Result Value Ref Range   Cholesterol, Total 188 100 - 199 mg/dL   Triglycerides 124 0 -  149 mg/dL   HDL 51 >39 mg/dL   VLDL Cholesterol Cal 22 5 - 40 mg/dL   LDL Chol Calc (NIH) 115 (H) 0 - 99 mg/dL   LDL/HDL Ratio 2.3 0.0 - 3.6 ratio  Hepatitis C antibody  Result Value Ref Range   Hep C Virus Ab <0.1 0.0 - 0.9 s/co ratio  Hemoglobin A1c  Result Value Ref Range   Hgb A1c MFr Bld 5.4 4.8 - 5.6 %   Est. average glucose Bld gHb Est-mCnc 108 mg/dL      Assessment & Plan:   Problem List Items Addressed This Visit       Other   Chewing tobacco use    Recommend cessation.      Family history of early CAD   Relevant Orders   Lipid panel   Hemoglobin A1c   Obesity   Relevant Orders   Comprehensive metabolic panel   Lipid panel   Hemoglobin A1c   Pure hypercholesterolemia    Recheck labs. Discussed diet, exercise as part of healthy lifestyle.      Relevant Orders   Comprehensive metabolic panel   Lipid panel   Hemoglobin A1c   Other Visit Diagnoses     Annual physical exam    -  Primary   Relevant Orders   Comprehensive metabolic panel   CBC with Differential   Lipid panel   Hemoglobin A1c   Need for influenza vaccination       Relevant Orders   Flu Vaccine QUAD 31mo+IM (Fluarix, Fluzone & Alfiuria Quad PF) (Completed)        LABORATORY TESTING:  Health maintenance labs ordered today as discussed above.   IMMUNIZATIONS:   - Tdap: Tetanus vaccination status reviewed: last tetanus booster within 10 years. - Influenza: Administered today - Pneumococcal: Not applicable - HPV: Not applicable - Shingrix vaccine: Not applicable - COVID vaccine: UTD  SCREENING: - Colonoscopy: Not applicable  Discussed with patient purpose of the colonoscopy is to detect colon cancer at curable precancerous or early stages   - AAA Screening: Not applicable  - Lung cancer screening: n/a  Hep C Screening: UTD STD testing and prevention (HIV/chl/gon/syphilis): no concerns Sexual History: Incontinence Symptoms:   PATIENT COUNSELING:    Advanced Care Planning: A  voluntary discussion about advance care planning including the explanation and discussion of advance directives.  Discussed health care proxy and Living will, and the patient was able to identify a health care proxy as wife, Phillip Lucas.  Patient does have a living will at present time. If patient does have living will, I have requested they bring this to the clinic to be scanned in to their chart.  Sexuality: Discussed sexually transmitted diseases, partner selection, use of condoms, avoidance of unintended pregnancy and contraceptive alternatives.   Advised to avoid cigarette smoking.  I discussed with the patient that most people either abstain from alcohol or drink within safe limits (<=14/week and <=4 drinks/occasion for males, <=7/weeks and <= 3 drinks/occasion for females) and that the risk for alcohol disorders and other health effects rises proportionally with the number of drinks  per week and how often a drinker exceeds daily limits.  Discussed cessation/primary prevention of drug use and availability of treatment for abuse.   Diet: Encouraged to adjust caloric intake to maintain  or achieve ideal body weight, to reduce intake of dietary saturated fat and total fat, to limit sodium intake by avoiding high sodium foods and not adding table salt, and to maintain adequate dietary potassium and calcium preferably from fresh fruits, vegetables, and low-fat dairy products.    Stressed the importance of regular exercise.  Injury prevention: Discussed safety belts, safety helmets, smoke detector, smoking near bedding or upholstery.   Dental health: Discussed importance of regular tooth brushing, flossing, and dental visits.   Follow up plan: NEXT PREVENTATIVE PHYSICAL DUE IN 1 YEAR. Return in about 1 year (around 03/23/2023) for cpe.

## 2022-03-22 NOTE — Assessment & Plan Note (Signed)
Recommend cessation. ?

## 2022-03-22 NOTE — Patient Instructions (Signed)

## 2022-03-22 NOTE — Assessment & Plan Note (Signed)
Recheck labs. Discussed diet, exercise as part of healthy lifestyle.

## 2022-03-23 LAB — COMPREHENSIVE METABOLIC PANEL
ALT: 28 IU/L (ref 0–44)
AST: 26 IU/L (ref 0–40)
Albumin/Globulin Ratio: 2.1 (ref 1.2–2.2)
Albumin: 4.8 g/dL (ref 4.1–5.1)
Alkaline Phosphatase: 57 IU/L (ref 44–121)
BUN/Creatinine Ratio: 9 (ref 9–20)
BUN: 11 mg/dL (ref 6–20)
Bilirubin Total: 0.8 mg/dL (ref 0.0–1.2)
CO2: 23 mmol/L (ref 20–29)
Calcium: 9.4 mg/dL (ref 8.7–10.2)
Chloride: 104 mmol/L (ref 96–106)
Creatinine, Ser: 1.18 mg/dL (ref 0.76–1.27)
Globulin, Total: 2.3 g/dL (ref 1.5–4.5)
Glucose: 97 mg/dL (ref 70–99)
Potassium: 4.3 mmol/L (ref 3.5–5.2)
Sodium: 141 mmol/L (ref 134–144)
Total Protein: 7.1 g/dL (ref 6.0–8.5)
eGFR: 82 mL/min/{1.73_m2} (ref >59)

## 2022-03-23 LAB — CBC WITH DIFFERENTIAL/PLATELET
Basophils Absolute: 0 10*3/uL (ref 0.0–0.2)
Basos: 1 %
EOS (ABSOLUTE): 0.1 10*3/uL (ref 0.0–0.4)
Eos: 2 %
Hematocrit: 45.3 % (ref 37.5–51.0)
Hemoglobin: 15.3 g/dL (ref 13.0–17.7)
Immature Grans (Abs): 0 10*3/uL (ref 0.0–0.1)
Immature Granulocytes: 0 %
Lymphocytes Absolute: 1.9 10*3/uL (ref 0.7–3.1)
Lymphs: 39 %
MCH: 33.8 pg — ABNORMAL HIGH (ref 26.6–33.0)
MCHC: 33.8 g/dL (ref 31.5–35.7)
MCV: 100 fL — ABNORMAL HIGH (ref 79–97)
Monocytes Absolute: 0.5 10*3/uL (ref 0.1–0.9)
Monocytes: 11 %
Neutrophils Absolute: 2.3 10*3/uL (ref 1.4–7.0)
Neutrophils: 47 %
Platelets: 222 10*3/uL (ref 150–450)
RBC: 4.52 x10E6/uL (ref 4.14–5.80)
RDW: 12.7 % (ref 11.6–15.4)
WBC: 4.9 10*3/uL (ref 3.4–10.8)

## 2022-03-23 LAB — HEMOGLOBIN A1C
Est. average glucose Bld gHb Est-mCnc: 103 mg/dL
Hgb A1c MFr Bld: 5.2 % (ref 4.8–5.6)

## 2022-03-23 LAB — LIPID PANEL
Chol/HDL Ratio: 4.2 ratio (ref 0.0–5.0)
Cholesterol, Total: 215 mg/dL — ABNORMAL HIGH (ref 100–199)
HDL: 51 mg/dL (ref >39)
LDL Chol Calc (NIH): 137 mg/dL — ABNORMAL HIGH (ref 0–99)
Triglycerides: 152 mg/dL — ABNORMAL HIGH (ref 0–149)
VLDL Cholesterol Cal: 27 mg/dL (ref 5–40)

## 2022-06-29 ENCOUNTER — Ambulatory Visit: Payer: BC Managed Care – PPO | Admitting: Family Medicine

## 2022-06-29 ENCOUNTER — Encounter: Payer: Self-pay | Admitting: Family Medicine

## 2022-06-29 VITALS — BP 129/80 | HR 74 | Temp 98.0°F | Resp 16 | Wt 229.0 lb

## 2022-06-29 DIAGNOSIS — R059 Cough, unspecified: Secondary | ICD-10-CM | POA: Diagnosis not present

## 2022-06-29 DIAGNOSIS — R0982 Postnasal drip: Secondary | ICD-10-CM

## 2022-06-29 MED ORDER — FLUTICASONE PROPIONATE 50 MCG/ACT NA SUSP: 2.0000 | Freq: Every day | NASAL | 1 refills | Status: AC

## 2022-06-29 MED ORDER — MONTELUKAST SODIUM 10 MG PO TABS: 10.0000 mg | ORAL_TABLET | Freq: Every day | ORAL | 1 refills | Status: AC

## 2022-06-29 NOTE — Progress Notes (Signed)
     I,April Miller,acting as a scribe for Lelon Huh, MD.,have documented all relevant documentation on the behalf of Lelon Huh, MD,as directed by  Lelon Huh, MD while in the presence of Lelon Huh, MD.   Established patient visit   Patient: Phillip Lucas   DOB: 1986/03/13   36 y.o. Male  MRN: [id number] Visit Date: 06/29/2022  Today's healthcare provider: Lelon Huh, MD   Chief Complaint  Patient presents with   Cough   Subjective    HPI  Patient has had sinus drainage and cough for 4 weeks. Patient states he has irritated throat and sinus pressure. No fever. Patient has been treating symptoms with Advil cold and sinus and Mucinex with moderate relief. Patient states he has coughed so much he is sore in his rib area.Feels drainage in the back of his throat. Cough productive clear sputum. Had some pressure in maxillary sinuses with clear drainage, improved with Advil cold and sinus.   Medications: No outpatient medications prior to visit.   No facility-administered medications prior to visit.    Review of Systems  Constitutional:  Negative for appetite change, chills and fever.  Respiratory:  Negative for chest tightness, shortness of breath and wheezing.   Cardiovascular:  Negative for chest pain and palpitations.  Gastrointestinal:  Negative for abdominal pain, nausea and vomiting.       Objective    BP 129/80 (BP Location: Left Arm, Patient Position: Sitting, Cuff Size: Large)   Pulse 74   Temp 98 F (36.7 C) (Oral)   Resp 16   Wt 229 lb (103.9 kg)   SpO2 96%   BMI 32.86 kg/m    General Appearance:    Mildly obese male, alert, cooperative, in no acute distress  HENT:   neck without nodes, sinuses nontender, post nasal drip noted, and nasal mucosa pale and congested  Eyes:    PERRL, conjunctiva/corneas clear, EOM's intact       Lungs:     Clear to auscultation bilaterally, respirations unlabored  Heart:    Normal heart rate. Normal rhythm. No  murmurs, rubs, or gallops.    Neurologic:   Awake, alert, oriented x 3. No apparent focal neurological           defect.         Assessment & Plan     1. Cough, unspecified type Secondary to post nasal drainage.  - montelukast (SINGULAIR) 10 MG tablet; Take 1 tablet (10 mg total) by mouth at bedtime.  Dispense: 30 tablet; Refill: 1  2. Post-nasal drainage Vaso-motor versus allergic rhinitis. No sign of infectious process - fluticasone (FLONASE) 50 MCG/ACT nasal spray; Place 2 sprays into both nostrils daily.  Dispense: 16 g; Refill: 1      The entirety of the information documented in the History of Present Illness, Review of Systems and Physical Exam were personally obtained by me. Portions of this information were initially documented by the CMA and reviewed by me for thoroughness and accuracy.     Lelon Huh, MD  [facility] [phone] (phone) [phone] (fax)  [place]

## 2023-03-24 ENCOUNTER — Encounter: Payer: BC Managed Care – PPO | Admitting: Family Medicine

## 2023-03-24 NOTE — Progress Notes (Deleted)
Complete physical exam  Patient: DEMARRI Lucas   DOB: 04/01/1986   37 y.o. Male  MRN: [id number]  Subjective:    No chief complaint on file.   Phillip Lucas is a 37 y.o. male who presents today for a complete physical exam. He reports consuming a {diet types:17450} diet. {types:19826} He generally feels {DESC; WELL/FAIRLY WELL/POORLY:18703}. He reports sleeping {DESC; WELL/FAIRLY WELL/POORLY:18703}. He {does/does not:200015} have additional problems to discuss today.    Discussed the use of AI scribe software for clinical note transcription with the patient, who gave verbal consent to proceed.  History of Present Illness           Most recent fall risk assessment:    03/22/2022    9:02 AM  Fall Risk   Falls in the past year? 0  Number falls in past yr: 0  Injury with Fall? 0  Risk for fall due to : No Fall Risks     Most recent depression screenings:    03/22/2022    9:01 AM 03/16/2021   11:10 AM  PHQ 2/9 Scores  PHQ - 2 Score 0 0  PHQ- 9 Score  0    {VISON DENTAL STD PSA (Optional):27386}  {History (Optional):23778}  Patient Care Team: Erasmo Downer, MD as PCP - General (Family Medicine)   Outpatient Medications Prior to Visit  Medication Sig   fluticasone (FLONASE) 50 MCG/ACT nasal spray Place 2 sprays into both nostrils daily.   montelukast (SINGULAIR) 10 MG tablet Take 1 tablet (10 mg total) by mouth at bedtime.   No facility-administered medications prior to visit.    ROS        Objective:     There were no vitals taken for this visit. {Vitals History (Optional):23777}  Physical Exam   No results found for any visits on 03/24/23. {Show previous labs (optional):23779}    Assessment & Plan:    Routine Health Maintenance and Physical Exam  Immunization History  Administered Date(s) Administered   Influenza,inj,Quad PF,6+ Mos 03/22/2022   Janssen (J&J) SARS-COV-2 Vaccination 10/02/2019   Moderna Sars-Covid-2 Vaccination  02/04/2021   Td 02/23/2017    Health Maintenance  Topic Date Due   COVID-19 Vaccine (3 - 2023-24 season) 03/26/2022   INFLUENZA VACCINE  02/24/2023   DTaP/Tdap/Td (2 - Tdap) 02/24/2027   Hepatitis C Screening  Completed   HIV Screening  Completed   HPV VACCINES  Aged Out    Discussed health benefits of physical activity, and encouraged him to engage in regular exercise appropriate for his age and condition.  Problem List Items Addressed This Visit   None              No follow-ups on file.     Shirlee Latch, MD

## 2023-04-01 ENCOUNTER — Encounter: Payer: Self-pay | Admitting: Family Medicine

## 2023-04-01 ENCOUNTER — Ambulatory Visit (INDEPENDENT_AMBULATORY_CARE_PROVIDER_SITE_OTHER): Payer: BC Managed Care – PPO | Admitting: Family Medicine

## 2023-04-01 VITALS — BP 122/88 | HR 67 | Temp 97.6°F | Resp 16 | Ht 70.0 in | Wt 203.0 lb

## 2023-04-01 DIAGNOSIS — R739 Hyperglycemia, unspecified: Secondary | ICD-10-CM

## 2023-04-01 DIAGNOSIS — Z0000 Encounter for general adult medical examination without abnormal findings: Secondary | ICD-10-CM

## 2023-04-01 DIAGNOSIS — E7800 Pure hypercholesterolemia, unspecified: Secondary | ICD-10-CM

## 2023-04-01 NOTE — Progress Notes (Signed)
Complete physical exam  Patient: Phillip Lucas   DOB: [birth year]   37 y.o. Male  MRN: [id number]  Subjective:    Chief Complaint  Patient presents with   Annual Exam    Phillip Lucas is a 37 y.o. male who presents today for a complete physical exam. He reports consuming a general diet.  He generally feels well. He reports sleeping well. He does not have additional problems to discuss today.    Discussed the use of AI scribe software for clinical note transcription with the patient, who gave verbal consent to proceed.  History of Present Illness   The patient presents for a routine physical. He has a history of high blood pressure, which has been gradually improving over the years. He has been actively working on weight loss and has seen significant progress, going from 276 lbs to 203 lbs over the past few years. He has been following a diet and has incorporated regular exercise into his routine.  In addition to lifestyle changes, the patient has been taking several supplements for overall health and stress management. These include 50mg  of zinc, ashwagandha, 500mg  of beetroot, and 1000mg  of turmeric. He reports no adverse effects from these supplements and believes they are helping him manage his health without the need for prescription medications.  The patient has no new complaints or concerns at this time. He is up-to-date with his eye and dental check-ups. He works at [employer] and plans to get his flu shot and COVID booster there as they are provided for free.       Most recent fall risk assessment:    04/01/2023    8:24 AM  Fall Risk   Falls in the past year? 0  Number falls in past yr: 0  Injury with Fall? 0  Risk for fall due to : No Fall Risks  Follow up Falls evaluation completed     Most recent depression screenings:    04/01/2023    8:24 AM 03/22/2022    9:01 AM  PHQ 2/9 Scores  PHQ - 2 Score 0 0  PHQ- 9 Score 0         Patient Care Team: Erasmo Downer, MD as PCP - General (Family Medicine)   Outpatient Medications Prior to Visit  Medication Sig   fluticasone (FLONASE) 50 MCG/ACT nasal spray Place 2 sprays into both nostrils daily.   montelukast (SINGULAIR) 10 MG tablet Take 1 tablet (10 mg total) by mouth at bedtime.   No facility-administered medications prior to visit.    ROS per HPI     Objective:     BP 122/88 (BP Location: Left Arm, Patient Position: Sitting, Cuff Size: Large)   Pulse 67   Temp 97.6 F (36.4 C) (Temporal)   Resp 16   Ht 5\' 10"  (1.778 m)   Wt 203 lb (92.1 kg)   BMI 29.13 kg/m    Physical Exam Vitals reviewed.  Constitutional:      General: He is not in acute distress.    Appearance: Normal appearance. He is well-developed. He is not diaphoretic.  HENT:     Head: Normocephalic and atraumatic.     Right Ear: Tympanic membrane, ear canal and external ear normal.     Left Ear: Tympanic membrane, ear canal and external ear normal.     Nose: Nose normal.     Mouth/Throat:     Mouth: Mucous membranes are moist.     Pharynx: Oropharynx is  clear. No oropharyngeal exudate.  Eyes:     General: No scleral icterus.    Conjunctiva/sclera: Conjunctivae normal.     Pupils: Pupils are equal, round, and reactive to light.  Neck:     Thyroid: No thyromegaly.  Cardiovascular:     Rate and Rhythm: Normal rate and regular rhythm.     Heart sounds: Normal heart sounds. No murmur heard. Pulmonary:     Effort: Pulmonary effort is normal. No respiratory distress.     Breath sounds: Normal breath sounds. No wheezing or rales.  Abdominal:     General: There is no distension.     Palpations: Abdomen is soft.     Tenderness: There is no abdominal tenderness.  Musculoskeletal:        General: No deformity.     Cervical back: Neck supple.     Right lower leg: No edema.     Left lower leg: No edema.  Lymphadenopathy:     Cervical: No cervical adenopathy.  Skin:    General: Skin is warm and dry.     Findings:  No rash.  Neurological:     Mental Status: He is alert and oriented to person, place, and time. Mental status is at baseline.     Gait: Gait normal.  Psychiatric:        Mood and Affect: Mood normal.        Behavior: Behavior normal.        Thought Content: Thought content normal.      No results found for any visits on 04/01/23.     Assessment & Plan:    Routine Health Maintenance and Physical Exam  Immunization History  Administered Date(s) Administered   Influenza,inj,Quad PF,6+ Mos 03/22/2022   Janssen (J&J) SARS-COV-2 Vaccination 10/02/2019   Moderna Sars-Covid-2 Vaccination 02/04/2021   Td 02/23/2017    Health Maintenance  Topic Date Due   COVID-19 Vaccine (3 - 2023-24 season) 03/27/2023   INFLUENZA VACCINE  10/24/2023 (Originally 02/24/2023)   DTaP/Tdap/Td (2 - Tdap) 02/24/2027   Hepatitis C Screening  Completed   HIV Screening  Completed   HPV VACCINES  Aged Out    Discussed health benefits of physical activity, and encouraged him to engage in regular exercise appropriate for his age and condition.  Problem List Items Addressed This Visit       Other   Pure hypercholesterolemia   Relevant Orders   Comprehensive metabolic panel   Lipid panel   Other Visit Diagnoses     Encounter for annual physical exam    -  Primary   Relevant Orders   Hemoglobin A1c   Comprehensive metabolic panel   Lipid panel   Hyperglycemia       Relevant Orders   Hemoglobin A1c          Weight Management Significant weight loss noted over the past years. -Encourage continuation of current lifestyle modifications and healthy diet.  General Health Maintenance -Order labs for kidney and liver function, blood sugar, and cholesterol. -Discuss flu shot and COVID booster. -Schedule next year's physical.  Supplement Use Patient taking zinc, ashwagandha, beetroot, and turmeric supplements. -No concerns identified with current supplement use.      Return in about 1 year  (around 03/31/2024) for CPE.     Shirlee Latch, MD

## 2023-04-02 LAB — COMPREHENSIVE METABOLIC PANEL
ALT: 16 IU/L (ref 0–44)
AST: 16 IU/L (ref 0–40)
Albumin: 4.7 g/dL (ref 4.1–5.1)
Alkaline Phosphatase: 55 IU/L (ref 44–121)
BUN/Creatinine Ratio: 15 (ref 9–20)
BUN: 14 mg/dL (ref 6–20)
Bilirubin Total: 0.5 mg/dL (ref 0.0–1.2)
CO2: 24 mmol/L (ref 20–29)
Calcium: 9.6 mg/dL (ref 8.7–10.2)
Chloride: 102 mmol/L (ref 96–106)
Creatinine, Ser: 0.92 mg/dL (ref 0.76–1.27)
Globulin, Total: 2.2 g/dL (ref 1.5–4.5)
Glucose: 102 mg/dL — ABNORMAL HIGH (ref 70–99)
Potassium: 4.8 mmol/L (ref 3.5–5.2)
Sodium: 141 mmol/L (ref 134–144)
Total Protein: 6.9 g/dL (ref 6.0–8.5)
eGFR: 109 mL/min/{1.73_m2} (ref >59)

## 2023-04-02 LAB — LIPID PANEL
Chol/HDL Ratio: 3.2 ratio (ref 0.0–5.0)
Cholesterol, Total: 188 mg/dL (ref 100–199)
HDL: 59 mg/dL (ref >39)
LDL Chol Calc (NIH): 121 mg/dL — ABNORMAL HIGH (ref 0–99)
Triglycerides: 40 mg/dL (ref 0–149)
VLDL Cholesterol Cal: 8 mg/dL (ref 5–40)

## 2023-04-02 LAB — HEMOGLOBIN A1C
Est. average glucose Bld gHb Est-mCnc: 108 mg/dL
Hgb A1c MFr Bld: 5.4 % (ref 4.8–5.6)

## 2023-09-30 ENCOUNTER — Encounter: Payer: Self-pay | Admitting: Family Medicine

## 2023-09-30 ENCOUNTER — Ambulatory Visit: Payer: 59 | Admitting: Family Medicine

## 2023-09-30 VITALS — BP 123/84 | HR 85 | Ht 70.5 in | Wt 191.4 lb

## 2023-09-30 DIAGNOSIS — N529 Male erectile dysfunction, unspecified: Secondary | ICD-10-CM | POA: Diagnosis not present

## 2023-09-30 MED ORDER — TADALAFIL 5 MG PO TABS: 5.0000 mg | ORAL_TABLET | Freq: Every day | ORAL | 11 refills | Status: AC

## 2023-09-30 NOTE — Assessment & Plan Note (Signed)
 Erectile dysfunction (ED) with difficulty achieving an erection. Increased sexual activity with a new partner and stress post-vasectomy noted. No significant vascular risk factors; blood pressure and cholesterol levels are within acceptable ranges. Weight loss and regular exercise are beneficial for ED. Screening testosterone levels as a potential contributing factor. Discussed treatment options: daily vs. as-needed medication. Opted for daily Cialis 5 mg. Informed about risks, including prolonged erection (>4 hours) requiring medical attention, and potential interaction with nitroglycerin causing unsafe drop in blood pressure. Generic Cialis available, insurance coverage for ED meds is poor, GoodRx coupons suggested. - Prescribe Cialis 5 mg daily - Order morning testosterone level test - Follow up if testosterone levels are low for potential repeat testing and further discussion on testosterone therapy - Send prescription to [facility] - Schedule follow-up in six months or sooner if issues persist

## 2023-09-30 NOTE — Progress Notes (Signed)
 Established patient visit   Patient: Phillip Lucas   DOB: [birth year]   38 y.o. Male  MRN: [id number] Visit Date: 09/30/2023  Today's healthcare provider: Shirlee Latch, MD   Chief Complaint  Patient presents with   Erectile Dysfunction    Patient reports concerns in the last few weeks. Reports he is in middle of divorce and with some one new    Subjective    Erectile Dysfunction   HPI     Erectile Dysfunction    Additional comments: Patient reports concerns in the last few weeks. Reports he is in middle of divorce and with some one new       Last edited by Acey Lav, CMA on 09/30/2023 10:14 AM.       Discussed the use of AI scribe software for clinical note transcription with the patient, who gave verbal consent to proceed.  History of Present Illness   The patient, with a history of vasectomy, presents with erectile dysfunction. He reports difficulty achieving an erection, describing it as "not as strong" as it was ten years ago. The patient notes that this issue has become more prominent since his vasectomy and has been exacerbated by recent life stressors, including a divorce and a new relationship. The patient's new partner is more sexually active than his previous partner, which has brought the issue to the forefront. The patient denies any other health problems and has been maintaining a healthy lifestyle, including regular exercise and significant weight loss.         Medications: Outpatient Medications Prior to Visit  Medication Sig   fluticasone (FLONASE) 50 MCG/ACT nasal spray Place 2 sprays into both nostrils daily.   montelukast (SINGULAIR) 10 MG tablet Take 1 tablet (10 mg total) by mouth at bedtime.   No facility-administered medications prior to visit.    Review of Systems     Objective    BP 123/84 (BP Location: Right Arm, Patient Position: Sitting, Cuff Size: Large)   Pulse 85   Ht 5' 10.5" (1.791 m)   Wt 191 lb 6.4 oz (86.8  kg)   SpO2 100%   BMI 27.07 kg/m    Physical Exam Vitals reviewed.  Constitutional:      General: He is not in acute distress.    Appearance: Normal appearance. He is not diaphoretic.  HENT:     Head: Normocephalic and atraumatic.  Eyes:     General: No scleral icterus.    Conjunctiva/sclera: Conjunctivae normal.  Cardiovascular:     Rate and Rhythm: Normal rate and regular rhythm.     Heart sounds: Normal heart sounds. No murmur heard. Pulmonary:     Effort: Pulmonary effort is normal. No respiratory distress.     Breath sounds: Normal breath sounds. No wheezing or rhonchi.  Musculoskeletal:     Cervical back: Neck supple.     Right lower leg: No edema.     Left lower leg: No edema.  Lymphadenopathy:     Cervical: No cervical adenopathy.  Skin:    General: Skin is warm and dry.     Findings: No rash.  Neurological:     Mental Status: He is alert and oriented to person, place, and time. Mental status is at baseline.  Psychiatric:        Mood and Affect: Mood normal.        Behavior: Behavior normal.      No results found for any visits on 09/30/23.  Assessment &  Plan     Problem List Items Addressed This Visit       Other   Erectile dysfunction - Primary   Erectile dysfunction (ED) with difficulty achieving an erection. Increased sexual activity with a new partner and stress post-vasectomy noted. No significant vascular risk factors; blood pressure and cholesterol levels are within acceptable ranges. Weight loss and regular exercise are beneficial for ED. Screening testosterone levels as a potential contributing factor. Discussed treatment options: daily vs. as-needed medication. Opted for daily Cialis 5 mg. Informed about risks, including prolonged erection (>4 hours) requiring medical attention, and potential interaction with nitroglycerin causing unsafe drop in blood pressure. Generic Cialis available, insurance coverage for ED meds is poor, GoodRx coupons  suggested. - Prescribe Cialis 5 mg daily - Order morning testosterone level test - Follow up if testosterone levels are low for potential repeat testing and further discussion on testosterone therapy - Send prescription to [facility] pharmacy - Schedule follow-up in six months or sooner if issues persist      Relevant Orders   TestT+TestF+SHBG        General Health Maintenance Generally healthy with significant weight loss and regular exercise. Blood pressure is well-controlled. - Continue regular exercise and healthy diet - Monitor weight and maintain current healthy lifestyle - Follow up in six months for routine physical        Return if symptoms worsen or fail to improve.       Shirlee Latch, MD  [facility] [phone] (phone) [phone] (fax)  [facility]

## 2024-04-02 ENCOUNTER — Encounter: Payer: Self-pay | Admitting: Family Medicine

## 2024-04-02 ENCOUNTER — Ambulatory Visit (INDEPENDENT_AMBULATORY_CARE_PROVIDER_SITE_OTHER): Payer: Self-pay | Admitting: Family Medicine

## 2024-04-02 VITALS — BP 122/78 | HR 79 | Ht 71.0 in | Wt 220.7 lb

## 2024-04-02 DIAGNOSIS — Z720 Tobacco use: Secondary | ICD-10-CM | POA: Diagnosis not present

## 2024-04-02 DIAGNOSIS — Z8249 Family history of ischemic heart disease and other diseases of the circulatory system: Secondary | ICD-10-CM

## 2024-04-02 DIAGNOSIS — Z0000 Encounter for general adult medical examination without abnormal findings: Secondary | ICD-10-CM

## 2024-04-02 DIAGNOSIS — Z23 Encounter for immunization: Secondary | ICD-10-CM

## 2024-04-02 DIAGNOSIS — E7800 Pure hypercholesterolemia, unspecified: Secondary | ICD-10-CM | POA: Diagnosis not present

## 2024-04-02 DIAGNOSIS — R739 Hyperglycemia, unspecified: Secondary | ICD-10-CM

## 2024-04-02 NOTE — Progress Notes (Signed)
 Complete physical exam   Patient: Phillip Lucas   DOB: [birth year]   38 y.o. Male  MRN: [id number] Visit Date: 04/02/2024  Today's healthcare provider: Jon Eva, MD   Chief Complaint  Patient presents with   Annual Exam    Last completed 04/01/23 Diet -  on and off keto for last 6 years Exercise - walking 10 miles a day Feeling - well  Sleeping - well Concerns - none   Immunizations    Influenza ok to order HPV declined Hepatitis B declined   Subjective    Phillip Lucas is a 38 y.o. male who presents today for a complete physical exam.   Discussed the use of AI scribe software for clinical note transcription with the patient, who gave verbal consent to proceed.  History of Present Illness   Phillip Lucas is a 38 year old male who presents for an annual physical exam.  He is here for a routine wellness check and screening. He will receive a flu shot during this visit. He recalls receiving the hepatitis B vaccine series in childhood and is not due for a tetanus shot for another three years. He regularly visits the eye doctor and dentist, utilizing his state insurance.  He uses chewing tobacco and is considering quitting after his divorce, preferring to address this habit during a less stressful period.  He has gained forty pounds over the past six months, attributing it to stress and changes in eating habits due to his current life circumstances. He is not adhering to a specific diet plan and is aware of his slightly elevated cholesterol levels, which he attributes to recent dietary changes.  He is currently going through a divorce, which he describes as 'interesting' and acknowledges the stress associated with it. No hearing issues or current abdominal pain.       Last depression screening scores    04/02/2024    8:27 AM 04/01/2023    8:24 AM 03/22/2022    9:01 AM  PHQ 2/9 Scores  PHQ - 2 Score 0 0 0  PHQ- 9 Score  0    Last fall risk  screening    04/02/2024    8:26 AM  Fall Risk   Falls in the past year? 0  Number falls in past yr: 0  Injury with Fall? 0  Risk for fall due to : No Fall Risks  Follow up Falls evaluation completed        Medications: Outpatient Medications Prior to Visit  Medication Sig   fluticasone  (FLONASE ) 50 MCG/ACT nasal spray Place 2 sprays into both nostrils daily.   montelukast  (SINGULAIR ) 10 MG tablet Take 1 tablet (10 mg total) by mouth at bedtime.   tadalafil  (CIALIS ) 5 MG tablet Take 1 tablet (5 mg total) by mouth daily.   No facility-administered medications prior to visit.    Review of Systems    Objective    BP 122/78 (BP Location: Right Arm, Patient Position: Sitting, Cuff Size: Large)   Pulse 79   Ht 5' 11 (1.803 m)   Wt 220 lb 11.2 oz (100.1 kg)   SpO2 98%   BMI 30.78 kg/m    Physical Exam Vitals reviewed.  Constitutional:      General: He is not in acute distress.    Appearance: Normal appearance. He is well-developed. He is not diaphoretic.  HENT:     Head: Normocephalic and atraumatic.     Right Ear: Tympanic membrane,  ear canal and external ear normal.     Left Ear: Tympanic membrane, ear canal and external ear normal.     Nose: Nose normal.     Mouth/Throat:     Mouth: Mucous membranes are moist.     Pharynx: Oropharynx is clear. No oropharyngeal exudate.  Eyes:     General: No scleral icterus.    Conjunctiva/sclera: Conjunctivae normal.     Pupils: Pupils are equal, round, and reactive to light.  Neck:     Thyroid: No thyromegaly.  Cardiovascular:     Rate and Rhythm: Normal rate and regular rhythm.     Heart sounds: Normal heart sounds. No murmur heard. Pulmonary:     Effort: Pulmonary effort is normal. No respiratory distress.     Breath sounds: Normal breath sounds. No wheezing or rales.  Abdominal:     General: There is no distension.     Palpations: Abdomen is soft.     Tenderness: There is no abdominal tenderness.  Musculoskeletal:         General: No deformity.     Cervical back: Neck supple.     Right lower leg: No edema.     Left lower leg: No edema.  Lymphadenopathy:     Cervical: No cervical adenopathy.  Skin:    General: Skin is warm and dry.     Findings: No rash.  Neurological:     Mental Status: He is alert and oriented to person, place, and time. Mental status is at baseline.     Gait: Gait normal.  Psychiatric:        Mood and Affect: Mood normal.        Behavior: Behavior normal.        Thought Content: Thought content normal.      No results found for any visits on 04/02/24.  Assessment & Plan    Routine Health Maintenance and Physical Exam  Exercise Activities and Dietary recommendations  Goals   None     Immunization History  Administered Date(s) Administered   Influenza,inj,Quad PF,6+ Mos 03/22/2022   Janssen (J&J) SARS-COV-2 Vaccination 10/02/2019   Moderna Sars-Covid-2 Vaccination 02/04/2021   Td 02/23/2017    Health Maintenance  Topic Date Due   Hepatitis B Vaccines 19-59 Average Risk (1 of 3 - 19+ 3-dose series) Never done   HPV VACCINES (1 - 3-dose SCDM series) Never done   Influenza Vaccine  02/24/2024   COVID-19 Vaccine (3 - 2025-26 season) 03/26/2024   DTaP/Tdap/Td (2 - Tdap) 02/24/2027   Hepatitis C Screening  Completed   HIV Screening  Completed   Pneumococcal Vaccine  Aged Out   Meningococcal B Vaccine  Aged Out    Discussed health benefits of physical activity, and encouraged him to engage in regular exercise appropriate for his age and condition.  Problem List Items Addressed This Visit       Other   Family history of early CAD   Chewing tobacco use   Pure hypercholesterolemia   Relevant Orders   Comprehensive metabolic panel with GFR   Lipid panel   Other Visit Diagnoses       Encounter for annual physical exam    -  Primary   Relevant Orders   Hemoglobin A1c   Comprehensive metabolic panel with GFR   Lipid panel     Immunization due        Relevant Orders   Flu vaccine trivalent PF, 6mos and older(Flulaval,Afluria,Fluarix,Fluzone)     Hyperglycemia  Relevant Orders   Hemoglobin A1c           Adult Wellness Visit Routine adult wellness visit. Discussed wellness and screening, including flu shot, hepatitis B vaccine, tetanus shot, and HPV vaccine. Labs for kidney and liver function, blood sugar, cholesterol, and A1c are due. Cancer screenings for colon and prostate cancer are not yet due based on age. He is up to date on eye and dental exams. - Administer flu shot - Order labs for kidney and liver function, blood sugar, cholesterol, and A1c - Schedule next year's physical  Pure hypercholesterolemia Cholesterol levels are slightly elevated but not in a concerning range. Weight gain of 40 pounds over six months, likely due to stress and dietary changes. No immediate intervention required. - Monitor cholesterol levels  Tobacco use Continues to use chewing tobacco with no current interest in quitting due to ongoing stress from divorce. Acknowledged the health risks associated with tobacco use. - Revisit tobacco cessation discussion in the future        Return in about 1 year (around 04/02/2025) for CPE.     Jon Eva, MD  [facility] [phone] (phone) [phone] (fax)  [facility]

## 2024-04-03 ENCOUNTER — Ambulatory Visit: Payer: Self-pay | Admitting: Family Medicine

## 2024-04-03 LAB — COMPREHENSIVE METABOLIC PANEL WITH GFR
ALT: 24 IU/L (ref 0–44)
AST: 18 IU/L (ref 0–40)
Albumin: 4.7 g/dL (ref 4.1–5.1)
Alkaline Phosphatase: 54 IU/L (ref 44–121)
BUN/Creatinine Ratio: 14 (ref 9–20)
BUN: 16 mg/dL (ref 6–20)
Bilirubin Total: 0.7 mg/dL (ref 0.0–1.2)
CO2: 23 mmol/L (ref 20–29)
Calcium: 9.7 mg/dL (ref 8.7–10.2)
Chloride: 102 mmol/L (ref 96–106)
Creatinine, Ser: 1.12 mg/dL (ref 0.76–1.27)
Globulin, Total: 2.2 g/dL (ref 1.5–4.5)
Glucose: 98 mg/dL (ref 70–99)
Potassium: 4.8 mmol/L (ref 3.5–5.2)
Sodium: 140 mmol/L (ref 134–144)
Total Protein: 6.9 g/dL (ref 6.0–8.5)
eGFR: 86 mL/min/1.73 (ref >59)

## 2024-04-03 LAB — LIPID PANEL
Chol/HDL Ratio: 4.8 ratio (ref 0.0–5.0)
Cholesterol, Total: 214 mg/dL — ABNORMAL HIGH (ref 100–199)
HDL: 45 mg/dL (ref >39)
LDL Chol Calc (NIH): 143 mg/dL — ABNORMAL HIGH (ref 0–99)
Triglycerides: 146 mg/dL (ref 0–149)
VLDL Cholesterol Cal: 26 mg/dL (ref 5–40)

## 2024-04-03 LAB — HEMOGLOBIN A1C
Est. average glucose Bld gHb Est-mCnc: 103 mg/dL
Hgb A1c MFr Bld: 5.2 % (ref 4.8–5.6)

## 2025-04-04 ENCOUNTER — Encounter: Admitting: Family Medicine
